# Patient Record
Sex: Male | Born: 1945 | Race: White | Hispanic: No | Marital: Married | Smoking: Former smoker
Health system: Southern US, Community
[De-identification: ages and names within clinical notes are randomized; demographics above are authoritative.]

## PROBLEM LIST (undated history)

## (undated) DIAGNOSIS — Z950 Presence of cardiac pacemaker: Secondary | ICD-10-CM

## (undated) DIAGNOSIS — F1010 Alcohol abuse, uncomplicated: Secondary | ICD-10-CM

## (undated) DIAGNOSIS — I350 Nonrheumatic aortic (valve) stenosis: Secondary | ICD-10-CM

## (undated) DIAGNOSIS — F32A Depression, unspecified: Secondary | ICD-10-CM

## (undated) DIAGNOSIS — E7800 Pure hypercholesterolemia, unspecified: Secondary | ICD-10-CM

## (undated) DIAGNOSIS — E785 Hyperlipidemia, unspecified: Secondary | ICD-10-CM

## (undated) DIAGNOSIS — I10 Essential (primary) hypertension: Secondary | ICD-10-CM

## (undated) HISTORY — PX: CARDIAC CATHETERIZATION: SHX172

## (undated) HISTORY — DX: Nonrheumatic aortic (valve) stenosis: I35.0

## (undated) HISTORY — DX: Presence of cardiac pacemaker: Z95.0

## (undated) HISTORY — PX: CORONARY ANGIOPLASTY: SHX604

## (undated) HISTORY — PX: INSERT / REPLACE / REMOVE PACEMAKER: SUR710

## (undated) HISTORY — PX: FOOT SURGERY: SHX648

## (undated) HISTORY — DX: Hyperlipidemia, unspecified: E78.5

## (undated) HISTORY — PX: CERVICAL FUSION: SHX112

---

## 1988-01-21 DIAGNOSIS — R011 Cardiac murmur, unspecified: Secondary | ICD-10-CM

## 1988-01-21 HISTORY — DX: Cardiac murmur, unspecified: R01.1

## 1993-01-20 DIAGNOSIS — D649 Anemia, unspecified: Secondary | ICD-10-CM

## 1993-01-20 HISTORY — DX: Anemia, unspecified: D64.9

## 1998-01-20 DIAGNOSIS — G4730 Sleep apnea, unspecified: Secondary | ICD-10-CM

## 1998-01-20 HISTORY — DX: Sleep apnea, unspecified: G47.30

## 2014-08-28 DIAGNOSIS — Z950 Presence of cardiac pacemaker: Secondary | ICD-10-CM

## 2014-08-28 HISTORY — DX: Presence of cardiac pacemaker: Z95.0

## 2016-01-21 DIAGNOSIS — I509 Heart failure, unspecified: Secondary | ICD-10-CM

## 2016-01-21 HISTORY — DX: Heart failure, unspecified: I50.9

## 2017-01-20 HISTORY — PX: FRACTURE SURGERY: SHX138

## 2020-03-26 ENCOUNTER — Emergency Department (HOSPITAL_COMMUNITY): Payer: Medicare HMO

## 2020-03-26 ENCOUNTER — Encounter (HOSPITAL_COMMUNITY): Payer: Self-pay | Admitting: Student

## 2020-03-26 ENCOUNTER — Emergency Department (HOSPITAL_COMMUNITY)
Admission: EM | Admit: 2020-03-26 | Discharge: 2020-03-27 | Disposition: A | Discharge status: Home / Self Care | Payer: Medicare HMO | Source: Home / Self Care | Attending: Emergency Medicine | Admitting: Emergency Medicine

## 2020-03-26 ENCOUNTER — Other Ambulatory Visit: Payer: Self-pay

## 2020-03-26 DIAGNOSIS — I130 Hypertensive heart and chronic kidney disease with heart failure and stage 1 through stage 4 chronic kidney disease, or unspecified chronic kidney disease: Secondary | ICD-10-CM | POA: Insufficient documentation

## 2020-03-26 DIAGNOSIS — N189 Chronic kidney disease, unspecified: Secondary | ICD-10-CM | POA: Insufficient documentation

## 2020-03-26 DIAGNOSIS — M25512 Pain in left shoulder: Secondary | ICD-10-CM | POA: Insufficient documentation

## 2020-03-26 DIAGNOSIS — I509 Heart failure, unspecified: Secondary | ICD-10-CM | POA: Insufficient documentation

## 2020-03-26 DIAGNOSIS — I25110 Atherosclerotic heart disease of native coronary artery with unstable angina pectoris: Secondary | ICD-10-CM | POA: Diagnosis not present

## 2020-03-26 DIAGNOSIS — R6884 Jaw pain: Secondary | ICD-10-CM | POA: Insufficient documentation

## 2020-03-26 DIAGNOSIS — R0789 Other chest pain: Secondary | ICD-10-CM | POA: Insufficient documentation

## 2020-03-26 DIAGNOSIS — I249 Acute ischemic heart disease, unspecified: Secondary | ICD-10-CM | POA: Diagnosis not present

## 2020-03-26 DIAGNOSIS — R079 Chest pain, unspecified: Secondary | ICD-10-CM

## 2020-03-26 DIAGNOSIS — Z950 Presence of cardiac pacemaker: Secondary | ICD-10-CM | POA: Insufficient documentation

## 2020-03-26 DIAGNOSIS — R61 Generalized hyperhidrosis: Secondary | ICD-10-CM | POA: Insufficient documentation

## 2020-03-26 DIAGNOSIS — R42 Dizziness and giddiness: Secondary | ICD-10-CM | POA: Insufficient documentation

## 2020-03-26 HISTORY — DX: Depression, unspecified: F32.A

## 2020-03-26 HISTORY — DX: Essential (primary) hypertension: I10

## 2020-03-26 HISTORY — DX: Pure hypercholesterolemia, unspecified: E78.00

## 2020-03-26 HISTORY — DX: Alcohol abuse, uncomplicated: F10.10

## 2020-03-26 LAB — CBC
HCT: 41.1 % (ref 39.0–52.0)
Hemoglobin: 13.9 g/dL (ref 13.0–17.0)
MCH: 30.8 pg (ref 26.0–34.0)
MCHC: 33.8 g/dL (ref 30.0–36.0)
MCV: 91.1 fL (ref 80.0–100.0)
Platelets: 232 10*3/uL (ref 150–400)
RBC: 4.51 MIL/uL (ref 4.22–5.81)
RDW: 13.2 % (ref 11.5–15.5)
WBC: 6.3 10*3/uL (ref 4.0–10.5)
nRBC: 0 % (ref 0.0–0.2)

## 2020-03-26 LAB — TROPONIN I (HIGH SENSITIVITY): Troponin I (High Sensitivity): 31 ng/L — ABNORMAL HIGH (ref <18)

## 2020-03-26 LAB — BASIC METABOLIC PANEL
Anion gap: 11 (ref 5–15)
BUN: 19 mg/dL (ref 8–23)
CO2: 21 mmol/L — ABNORMAL LOW (ref 22–32)
Calcium: 9.7 mg/dL (ref 8.9–10.3)
Chloride: 104 mmol/L (ref 98–111)
Creatinine, Ser: 1.08 mg/dL (ref 0.61–1.24)
GFR, Estimated: >60 mL/min (ref >60)
Glucose, Bld: 108 mg/dL — ABNORMAL HIGH (ref 70–99)
Potassium: 3.5 mmol/L (ref 3.5–5.1)
Sodium: 136 mmol/L (ref 135–145)

## 2020-03-26 MED ORDER — LIDOCAINE 5 % EX PTCH
1.0000 | MEDICATED_PATCH | CUTANEOUS | Status: DC
  Administered 2020-03-26: 1 via TRANSDERMAL
  Filled 2020-03-26: qty 1

## 2020-03-26 MED ORDER — ASPIRIN 81 MG PO CHEW
324.0000 mg | CHEWABLE_TABLET | Freq: Once | ORAL | Status: AC
  Administered 2020-03-26: 324 mg via ORAL
  Filled 2020-03-26: qty 4

## 2020-03-26 MED ORDER — ACETAMINOPHEN 500 MG PO TABS
1000.0000 mg | ORAL_TABLET | Freq: Once | ORAL | Status: AC
  Administered 2020-03-26: 1000 mg via ORAL
  Filled 2020-03-26: qty 2

## 2020-03-26 NOTE — ED Triage Notes (Signed)
Patient BIB EMS after checking into fellowship after being discharged from [facility]. Patient was in [facility] for alcohol detox and was sent to fellowship.  Patient had abnormal ekg upon check and ems was called. Patient complains of mild jaw pain and chest pain.  Reports has some diaphoresis once in the ambulance and has been dizzy today.

## 2020-03-26 NOTE — ED Provider Notes (Signed)
[facility] EMERGENCY DEPARTMENT Provider Note   CSN: [id number] Arrival date & time: 03/26/20  2155     History Chief Complaint  Patient presents with  . Abnormal ECG    Charles [name] is a 75 y.o. male with a hx of alcohol abuse, anxiety, aortic valve stensosi, CHF, CKD, hypertension, hyperlipidemia, and sick sinus syndrome with cardiac pacemaker who presents to the emergency department from [facility] for an abnormal EKG at their facility.  Patient states he was recently discharged from [facility] after admission for EtOH withdrawal- [facility]. Today upon intake they performed their protocol EKG on admission to their facility and found it to be abnormal therefore they called EMS.  Patient states that he was completely asymptomatic upon the time of the EKG.  However after EMS arrived and he was on the way here he did develop some aching left shoulder/chest/jaw discomfort with an episodes of diaphoresis & dizziness.  No alleviating or aggravating factors to his symptoms.  No change with exertion or deep breathing.  He states he has had similar pain in the past, had some during his most recent hospitalization and when he was in the hospital in December with chest pain.  He denies nausea, vomiting, diaphoresis, syncope, or acute leg pain/swelling.   Per chart review for additional history patient with recent hospital admission 12/31/2019 to [facility], had stable high-sensitivity troponins in the 50s, was seen by cardiology and cleared for discharge without ischemic work-up at that time.  HPI     Past Medical History:  Diagnosis Date  . Alcohol abuse   . Depression   . High cholesterol   . Hypertension     There are no problems to display for this patient.   Past Surgical History:  Procedure Laterality Date  . CERVICAL FUSION    . FOOT SURGERY         History reviewed. No pertinent  family history.  Social History   Tobacco Use  . Smoking status: Never Smoker  . Smokeless tobacco: Never Used  Substance Use Topics  . Alcohol use: Yes    Comment: 3/4 a 5th daily  . Drug use: Never    Home Medications Prior to Admission medications   Not on File    Allergies    Gabapentin and Tramadol  Review of Systems   Review of Systems  Constitutional: Positive for diaphoresis. Negative for chills and fever.  Respiratory: Negative for shortness of breath.   Cardiovascular: Positive for chest pain.  Gastrointestinal: Negative for abdominal pain, nausea and vomiting.  Neurological: Negative for seizures, syncope, speech difficulty, weakness and numbness.  All other systems reviewed and are negative.   Physical Exam Updated Vital Signs BP 139/77 (BP Location: Right Arm)   Pulse 86   Temp 98.5 F (36.9 C) (Oral)   Resp 16   SpO2 96%   Physical Exam Vitals and nursing note reviewed.  Constitutional:      General: He is not in acute distress.    Appearance: He is well-developed. He is not toxic-appearing.  HENT:     Head: Normocephalic and atraumatic.  Eyes:     General:        Right eye: No discharge.        Left eye: No discharge.     Conjunctiva/sclera: Conjunctivae normal.  Neck:     Comments: R proximal anterior neck w/ surgical scar present- well  healed, some scar tissue noted.  Cardiovascular:     Rate and Rhythm: Normal rate and regular rhythm.     Heart sounds: Murmur heard.      Comments: 2+ symmetric radial pulses. Pulmonary:     Effort: Pulmonary effort is normal. No respiratory distress.     Breath sounds: Normal breath sounds. No wheezing, rhonchi or rales.     Comments: Pacemaker present.  Chest:     Chest wall: Tenderness (left upper chest wall tenderness to palpation which reproduces patient's pain) present.  Abdominal:     General: There is no distension.     Palpations: Abdomen is soft.     Tenderness: There is no abdominal  tenderness. There is no guarding or rebound.  Musculoskeletal:     Cervical back: Neck supple.     Comments: No significant LE edema. No calf tenderness.   Skin:    General: Skin is warm and dry.     Findings: No rash.  Neurological:     Mental Status: He is alert.     Comments: Clear speech.   Psychiatric:        Behavior: Behavior normal.     ED Results / Procedures / Treatments   Labs (all labs ordered are listed, but only abnormal results are displayed) Labs Reviewed  BASIC METABOLIC PANEL - Abnormal; Notable for the following components:      Result Value   CO2 21 (*)    Glucose, Bld 108 (*)    All other components within normal limits  TROPONIN I (HIGH SENSITIVITY) - Abnormal; Notable for the following components:   Troponin I (High Sensitivity) 31 (*)    All other components within normal limits  TROPONIN I (HIGH SENSITIVITY) - Abnormal; Notable for the following components:   Troponin I (High Sensitivity) 34 (*)    All other components within normal limits  CBC    EKG EKG Interpretation  Date/Time:  Monday March 26 2020 21:57:11 EST Ventricular Rate:  83 PR Interval:    QRS Duration: 124 QT Interval:  423 QTC Calculation: 498 R Axis:   -2 Text Interpretation: Sinus rhythm Probable left atrial enlargement Left bundle branch block No old tracing to compare Confirmed by Pricilla Loveless [phone]) on 03/26/2020 10:02:18 PM   Radiology DG Chest Portable 1 View  Result Date: 03/26/2020 CLINICAL DATA:  Chest pain EXAM: PORTABLE CHEST 1 VIEW COMPARISON:  None. FINDINGS: Cardiac shadows within normal limits. Pacing device is noted. Postsurgical changes in the cervical spine are seen. Lungs are well aerated bilaterally. No focal infiltrate or sizable effusion is seen. IMPRESSION: No acute abnormality noted. Electronically Signed   By: Alcide Clever M.D.   On: 03/26/2020 22:39    Procedures Procedures   Medications Ordered in ED Medications  aspirin chewable tablet 324  mg (has no administration in time range)    ED Course  I have reviewed the triage vital signs and the nursing notes.  Pertinent labs & imaging results that were available during my care of the patient were reviewed by me and considered in my medical decision making (see chart for details).    MDM Rules/Calculators/A&P                          Patient presents to the ED for evaluation due to [facility]-patient asymptomatic at time of abnormal EKG, developed some mild chest/jaw/shoulder discomfort in route with EMS.  On arrival he  is nontoxic, resting comfortably, vitals without significant abnormality.  On exam he does have some reproducibility of pain with left anterior chest wall palpation.   Additional history obtained:  Additional history obtained from chart review & nursing note review.  Patient had admission to [facility] in December 2021, had troponins that were elevated but were overall fairly flat, was seen by cardiology who did not feel that an ischemic work-up was necessary at that time.  Also had a CT angio that was negative for pulmonary embolism or other acute cardiopulmonary abnormality. Echocardiogram was performed systolic function hyperdynamic with EF of 70% 5%.. He has a hx of normal coronary arteries by heart cath 2 or 3 years ago, has had multiple reassuring stress tests per patient report.   Initial EKG: Left bundle branch block noted.  Lab Tests:  I Ordered, reviewed, and interpreted labs, which included:  CBC, BMP, troponins: Fairly unremarkable, no critical anemia or significant actually derangement, troponins 3 1 then 34, no significant delta elevation.  Imaging Studies ordered:  I ordered imaging studies which included CXR, I independently reviewed, formal radiology impression shows:   No acute abnormality noted.  ED Course:  Patient did report some mild increase in his discomfort to nursing staff, Tylenol and Lidoderm patch  ordered given some reproducibility of pain with chest wall palpation. Repeat EKG without significant change.   00:30: Following above interventions patient is sleeping and resting comfortably.  Low risk Wells, feel pulmonary embolism would be less likely.  Symmetric pulses, no widened mediastinum on chest x-ray, low suspicion for dissection.  No significant abnormalities on laboratory or chest x-ray imaging, specifically no pneumothorax, pneumonia, or pulmonary edema. Heart pathway score 5, troponins without significant elevation and are improved from prior in care everywhere, no sxs until after there was concern @ facility due to EKG, I have a low suspicion for ACS. Overall reassuring ED work up. Appears appropriate [facility]. We discussed results, treatment plan, need for follow-up, and return precautions with the patient. Provided opportunity for questions, patient confirmed understanding and is in agreement with plan.   This is a shared visit with supervising physician Dr. Clayborne Dana who has independently evaluated patient & in agreement with care   Portions of this note were generated with Dragon dictation software. Dictation errors may occur despite best attempts at proofreading.  Final Clinical Impression(s) / ED Diagnoses Final diagnoses:  Chest pain, unspecified type    Rx / DC Orders ED Discharge Orders    None       Cherly Anderson, PA-C 03/27/20 0301    Mesner, Barbara Cower, MD 03/27/20 [phone]

## 2020-03-27 LAB — TROPONIN I (HIGH SENSITIVITY): Troponin I (High Sensitivity): 34 ng/L — ABNORMAL HIGH (ref <18)

## 2020-03-27 NOTE — Discharge Instructions (Signed)
You were seen in the emergency department today for chest pain. Your work-up in the emergency department has been overall reassuring. Your labs have been fairly normal and or similar to previous blood work you have had done. Your EKG showed a left bundle branch block- please discuss this with your cardiologist, your troponins were mildly elevated but improved from prior.   We would like you to follow up closely with your primary care provider and/or the cardiologist provided in your discharge instructions within 1-3 days. Return to the ER immediately should you experience any new or worsening symptoms including but not limited to return of pain, worsened pain, vomiting, shortness of breath, dizziness, lightheadedness, passing out, or any other concerns that you may have.

## 2020-03-27 NOTE — ED Notes (Signed)
Report given to [facility].  THeir therapist is on the way to pick up patient and transport him back to [facility]

## 2020-03-27 NOTE — ED Notes (Signed)
Patient sleeping at this time.

## 2020-03-28 ENCOUNTER — Inpatient Hospital Stay (HOSPITAL_COMMUNITY)
Admission: EM | Admit: 2020-03-28 | Discharge: 2020-03-31 | DRG: 287 | Disposition: A | Discharge status: Home / Self Care | Payer: Medicare HMO | Attending: Cardiovascular Disease | Admitting: Cardiovascular Disease

## 2020-03-28 ENCOUNTER — Emergency Department (HOSPITAL_COMMUNITY): Payer: Medicare HMO

## 2020-03-28 DIAGNOSIS — Z79899 Other long term (current) drug therapy: Secondary | ICD-10-CM

## 2020-03-28 DIAGNOSIS — I350 Nonrheumatic aortic (valve) stenosis: Secondary | ICD-10-CM | POA: Diagnosis present

## 2020-03-28 DIAGNOSIS — I447 Left bundle-branch block, unspecified: Secondary | ICD-10-CM | POA: Diagnosis present

## 2020-03-28 DIAGNOSIS — R079 Chest pain, unspecified: Secondary | ICD-10-CM

## 2020-03-28 DIAGNOSIS — Z955 Presence of coronary angioplasty implant and graft: Secondary | ICD-10-CM

## 2020-03-28 DIAGNOSIS — I495 Sick sinus syndrome: Secondary | ICD-10-CM | POA: Diagnosis present

## 2020-03-28 DIAGNOSIS — N182 Chronic kidney disease, stage 2 (mild): Secondary | ICD-10-CM | POA: Diagnosis present

## 2020-03-28 DIAGNOSIS — I249 Acute ischemic heart disease, unspecified: Secondary | ICD-10-CM | POA: Diagnosis present

## 2020-03-28 DIAGNOSIS — Z981 Arthrodesis status: Secondary | ICD-10-CM

## 2020-03-28 DIAGNOSIS — I25110 Atherosclerotic heart disease of native coronary artery with unstable angina pectoris: Principal | ICD-10-CM | POA: Diagnosis present

## 2020-03-28 DIAGNOSIS — I129 Hypertensive chronic kidney disease with stage 1 through stage 4 chronic kidney disease, or unspecified chronic kidney disease: Secondary | ICD-10-CM | POA: Diagnosis present

## 2020-03-28 DIAGNOSIS — E785 Hyperlipidemia, unspecified: Secondary | ICD-10-CM | POA: Diagnosis present

## 2020-03-28 DIAGNOSIS — Z20822 Contact with and (suspected) exposure to covid-19: Secondary | ICD-10-CM | POA: Diagnosis present

## 2020-03-28 DIAGNOSIS — Z950 Presence of cardiac pacemaker: Secondary | ICD-10-CM

## 2020-03-28 DIAGNOSIS — Z7982 Long term (current) use of aspirin: Secondary | ICD-10-CM

## 2020-03-28 DIAGNOSIS — Z79891 Long term (current) use of opiate analgesic: Secondary | ICD-10-CM

## 2020-03-28 DIAGNOSIS — Z885 Allergy status to narcotic agent status: Secondary | ICD-10-CM

## 2020-03-28 DIAGNOSIS — Z91013 Allergy to seafood: Secondary | ICD-10-CM

## 2020-03-28 DIAGNOSIS — Z888 Allergy status to other drugs, medicaments and biological substances status: Secondary | ICD-10-CM

## 2020-03-28 DIAGNOSIS — I421 Obstructive hypertrophic cardiomyopathy: Secondary | ICD-10-CM | POA: Diagnosis present

## 2020-03-28 DIAGNOSIS — Z9109 Other allergy status, other than to drugs and biological substances: Secondary | ICD-10-CM

## 2020-03-28 HISTORY — DX: Presence of cardiac pacemaker: Z95.0

## 2020-03-28 MED ORDER — FENTANYL CITRATE (PF) 100 MCG/2ML IJ SOLN: 50.0000 ug | Freq: Once | INTRAMUSCULAR | Status: DC

## 2020-03-28 NOTE — ED Provider Notes (Signed)
[facility] EMERGENCY DEPARTMENT Provider Note   CSN: [id number] Arrival date & time: 03/28/20  2316     History Chief Complaint  Patient presents with  . Chest Pain    Charles [name] is a 75 y.o. male with a history of alcohol abuse, anxiety, aortic valve stenosis, CHF, hypertension, hyperlipidemia, CKD, and sick sinus syndrome with cardiac pacemaker who presents [facility] the ED [facility] for evaluation of chest pain that began 1 hour PTA. Patient states pain is to the left chest, radiates to shoulder/jaw, feels like an ache, currently a 6/10 in severity without alleviating/aggravating factors. Has had some lightheadedness/diaphoresis with sxs. Received aspirin 324 mg en route by EMS, EMS relays facility gave him 1 nitroglycerin and his systolic BP went from 160 to 80. No additional NTG given. Patient denies nausea, vomiting, syncope, leg pain/swelling, or abdominal pain.   HPI     Past Medical History:  Diagnosis Date  . Alcohol abuse   . Depression   . High cholesterol   . Hypertension     There are no problems to display for this patient.   Past Surgical History:  Procedure Laterality Date  . CERVICAL FUSION    . FOOT SURGERY         No family history on file.  Social History   Tobacco Use  . Smoking status: Never Smoker  . Smokeless tobacco: Never Used  Substance Use Topics  . Alcohol use: Yes    Comment: 3/4 a 5th daily  . Drug use: Never    Home Medications Prior to Admission medications   Medication Sig Start Date End Date Taking? Authorizing Provider  amitriptyline (ELAVIL) 25 MG tablet Take 25 mg by mouth at bedtime.    [provider]  amLODipine (NORVASC) 5 MG tablet Take 5 mg by mouth daily. 01/05/20   [provider]  aspirin 81 MG EC tablet Take 81 mg by mouth daily. 12/07/19   [provider]  atorvastatin (LIPITOR) 40 MG tablet Take 40 mg by mouth daily. 09/30/19   [provider]  Calcium Ascorbate 500 MG TABS Take 500 mg by mouth in the morning and at bedtime.    [provider]  Cholecalciferol 50 MCG (2000 UT) CAPS Take 2,000 Units by mouth daily.    [provider]  DULoxetine (CYMBALTA) 60 MG capsule Take 60 mg by mouth daily. 03/26/20   [provider]  furosemide (LASIX) 20 MG tablet Take 20 mg by mouth daily. 03/07/20   [provider]  lisinopril (ZESTRIL) 40 MG tablet Take 40 mg by mouth daily. 08/30/19   [provider]  metoprolol tartrate (LOPRESSOR) 50 MG tablet Take 150 mg by mouth daily. 10/01/19   [provider]  omeprazole (PRILOSEC) 20 MG capsule Take 20 mg by mouth at bedtime. 03/07/20   [provider]  traZODone (DESYREL) 150 MG tablet Take 150 mg by mouth at bedtime. 02/10/20   [provider]    Allergies    Gabapentin and Tramadol  Review of Systems   Review of Systems  Constitutional: Positive for diaphoresis. Negative for fever.  Respiratory: Negative for shortness of breath.   Cardiovascular: Positive for chest pain.  Gastrointestinal: Negative for abdominal pain, nausea and vomiting.  Neurological: Positive for light-headedness. Negative for syncope.  All other systems reviewed and are negative.   Physical Exam Updated Vital Signs BP (!) 168/58   Pulse 60   Resp  10   SpO2 98%  Temp: 97.8 F orally  Physical Exam Vitals and nursing note reviewed.  Constitutional:      General: He is not in acute distress.    Appearance: He is well-developed. He is not toxic-appearing.  HENT:     Head: Normocephalic and atraumatic.  Eyes:     General:        Right eye: No discharge.        Left eye: No discharge.     Conjunctiva/sclera: Conjunctivae normal.  Cardiovascular:     Rate and Rhythm: Normal rate and regular rhythm.     Pulses:          Radial pulses are 2+ on the right side and 2+ on the left side.     Heart sounds: Murmur heard.     Pulmonary:     Effort: Pulmonary effort is normal. No respiratory distress.     Breath sounds: Normal breath sounds. No wheezing, rhonchi or rales.  Chest:     Chest wall: Tenderness (left anterior chest wall) present.     Comments: Left chest pacemaker present.  Abdominal:     General: There is no distension.     Palpations: Abdomen is soft.     Tenderness: There is no abdominal tenderness.  Musculoskeletal:     Cervical back: Neck supple.     Right lower leg: No tenderness. No edema.     Left lower leg: No tenderness. No edema.  Skin:    General: Skin is warm and dry.     Findings: No rash.  Neurological:     Mental Status: He is alert.     Comments: Clear speech.   Psychiatric:        Behavior: Behavior normal.     ED Results / Procedures / Treatments   Labs (all labs ordered are listed, but only abnormal results are displayed) Labs Reviewed  BASIC METABOLIC PANEL - Abnormal; Notable for the following components:      Result Value   Glucose, Bld 123 (*)    BUN 25 (*)    Creatinine, Ser 1.32 (*)    GFR, Estimated 56 (*)    All other components within normal limits  CBC - Abnormal; Notable for the following components:   RBC 4.21 (*)    All other components within normal limits  TROPONIN I (HIGH SENSITIVITY) - Abnormal; Notable for the following components:   Troponin I (High Sensitivity) 24 (*)    All other components within normal limits  TROPONIN I (HIGH SENSITIVITY) - Abnormal; Notable for the following components:   Troponin I (High Sensitivity) 24 (*)    All other components within normal limits  RESP PANEL BY RT-PCR (FLU A&B, COVID) [id number]  CBG MONITORING, ED    EKG EKG Interpretation  Date/Time:  Wednesday March 28 2020 23:20:37 EST Ventricular Rate:  67 PR Interval:    QRS Duration: 130 QT Interval:  447 QTC Calculation: 472 R Axis:   -20 Text Interpretation: Atrial-paced rhythm Left bundle branch block Confirmed by Marily Memos [phone]) on  03/28/2020 11:25:31 PM   Radiology CT Angio Chest PE W/Cm &/Or Wo Cm  Result Date: 03/29/2020 CLINICAL DATA:  Chest pain, dyspnea, pleurisy EXAM: CT ANGIOGRAPHY CHEST WITH CONTRAST TECHNIQUE: Multidetector CT imaging of the chest was performed using the standard protocol during bolus administration of intravenous contrast. Multiplanar CT image reconstructions and MIPs were obtained to evaluate the vascular anatomy. CONTRAST:  65mL OMNIPAQUE IOHEXOL 350 MG/ML  SOLN COMPARISON:  None. FINDINGS: Cardiovascular: There is adequate opacification of the a pulmonary arterial tree. There is no intraluminal filling defect identified to suggest acute pulmonary embolism. The central pulmonary arteries are of normal caliber. There is mild global cardiomegaly with moderate left ventricular hypertrophy noted. Moderate multi-vessel coronary artery calcification. Extensive calcification of the aortic valve leaflets. Mild calcification of the mitral valve annulus. No pericardial effusion. Mild atherosclerotic calcification within the thoracic aorta. The thoracic aorta is of normal caliber. Left subclavian dual lead pacemaker is seen with leads within the right atrium and right ventricle. Mediastinum/Nodes: The visualized thyroid is unremarkable. Multiple mildly enlarged right paratracheal and subcarinal lymph nodes are identified measuring up to 15 mm in short axis diameter. The esophagus is unremarkable. Lungs/Pleura: There is bibasilar atelectasis, left greater than right. The lungs are otherwise clear. No pneumothorax or pleural effusion. Central airways are widely patent. Upper Abdomen: At least mild hepatic steatosis noted. Limited images of the upper abdomen are otherwise unremarkable. Musculoskeletal: No acute bone abnormality. Osseous structures are age-appropriate. Review of the MIP images confirms the above findings. IMPRESSION: Mild global cardiomegaly with moderate left ventricular hypertrophy. Moderate multi-vessel  coronary artery calcification. Extensive degenerative calcification of the aortic valve leaflets. Echocardiography may be more helpful to assess valvular dysfunction. No pulmonary embolism. Mild hepatic steatosis. Aortic Atherosclerosis (ICD10-I70.0). Electronically Signed   By: Helyn Numbers MD   On: 03/29/2020 05:02   DG Chest Portable 1 View  Result Date: 03/28/2020 CLINICAL DATA:  Chest pain and shortness of breath. EXAM: PORTABLE CHEST 1 VIEW COMPARISON:  March 26, 2020 FINDINGS: A dual lead AICD is noted. Mild atelectasis is seen within the left lung base. This is a new finding when compared to the prior exam. There is no evidence of a pleural effusion or pneumothorax. Stable elevation of the right hemidiaphragm is seen. The heart size and mediastinal contours are within normal limits. A radiopaque fusion plate and screws are seen overlying the lower cervical spine. The visualized skeletal structures are otherwise unremarkable. IMPRESSION: Mild left basilar atelectasis. Electronically Signed   By: Aram Candela M.D.   On: 03/28/2020 23:38    Procedures Procedures   Medications Ordered in ED Medications  lidocaine (LIDODERM) 5 % 1 patch (1 patch Transdermal Patch Applied 03/29/20 0433)  sodium chloride 0.9 % bolus 500 mL (0 mLs Intravenous Stopped 03/29/20 0143)  acetaminophen (TYLENOL) tablet 650 mg (650 mg Oral Given 03/29/20 0040)  fentaNYL (SUBLIMAZE) injection 50 mcg (50 mcg Intravenous Given 03/29/20 0040)  iohexol (OMNIPAQUE) 350 MG/ML injection 75 mL (75 mLs Intravenous Contrast Given 03/29/20 0453)  fentaNYL (SUBLIMAZE) injection 50 mcg (50 mcg Intravenous Given 03/29/20 2706)    ED Course  I have reviewed the triage vital signs and the nursing notes.  Pertinent labs & imaging results that were available during my care of the patient were reviewed by me and considered in my medical decision making (see chart for details).    MDM Rules/Calculators/A&P                           Patient presents to the ED for evaluation of chest pain that began 1 hour PTA.  Nontoxic, vitals without significant abnormality.  On exam left chest wall tenderness.   Additional history obtained:  Additional history obtained from chart review & nursing note review.  Seen my myself & attending 2 days prior for similar.  Patient had admission to [facility] in  December 2021, had troponins that were elevated but were overall fairly flat, was seen by cardiology who did not feel that an ischemic work-up was necessary at that time.  Also had a CT angio that was negative for pulmonary embolism or other acute cardiopulmonary abnormality. Echocardiogram was performed systolic function hyperdynamic with EF of 70% 5%.. He has a hx of normal coronary arteries by heart cath 2 or 3 years ago, has had multiple reassuring stress tests per patient report.   Initial EKG: Left bundle branch block noted, atrial paced  Lab Tests:  I Ordered, reviewed, and interpreted labs, which included:  CBC, BMP, troponins: Fairly unremarkable, no critical anemia or significant actually derangement, troponins flat @ 24, no significant delta elevation, mild increase in creatinine- 500 cc fluid ordered.   Imaging Studies ordered:  I ordered imaging studies which included CXR & subsequent CTA of the chest, I independently reviewed, formal radiology impression shows:  CXR: Mild left basilar atelectasis. CTA: Mild global cardiomegaly with moderate left ventricular hypertrophy. Moderate multi-vessel coronary artery calcification. Extensive degenerative calcification of the aortic valve leaflets. Echocardiography may be more helpful to assess valvular dysfunction. No pulmonary embolism. Mild hepatic steatosis. Aortic Atherosclerosis  Given CT findings of moderate multi-vessel coronary artery calcification and this being patient's second ED visit this week for chest pain will discuss w/ cardiology for evaluation.  On re-assessment patient reports he had some improvement in chest pain S/p fentanyl but eventually re-occurred, had tried some non narcotic options however not much relief per patient report therefore will re-dose. Given drop in BP earlier, holding off on nitroglycerin.   06:10: CONSULT: Discussed with cardiologist Dr. Algie CofferKadakia- will evaluate patient in the ED, appreciate consultation.   Findings and plan of care discussed with supervising physician Dr. Clayborne DanaMesner who is in agreement.   Portions of this note were generated with Scientist, clinical (histocompatibility and immunogenetics)Dragon dictation software. Dictation errors may occur despite best attempts at proofreading.  Final Clinical Impression(s) / ED Diagnoses Final diagnoses:  Chest pain, unspecified type    Rx / DC Orders ED Discharge Orders    None       Cherly Andersonetrucelli, Sherell Christoffel R, PA-C 03/29/20 [id number]    Marily MemosMesner, Jason, MD 03/29/20 [phone]

## 2020-03-29 ENCOUNTER — Inpatient Hospital Stay (HOSPITAL_COMMUNITY)
Admit: 2020-03-29 | Discharge: 2020-03-29 | Disposition: A | Discharge status: Home / Self Care | Payer: Medicare HMO | Attending: Cardiovascular Disease | Admitting: Cardiovascular Disease

## 2020-03-29 ENCOUNTER — Inpatient Hospital Stay (HOSPITAL_COMMUNITY): Payer: Medicare HMO

## 2020-03-29 ENCOUNTER — Emergency Department (HOSPITAL_COMMUNITY): Payer: Medicare HMO

## 2020-03-29 ENCOUNTER — Other Ambulatory Visit: Payer: Self-pay

## 2020-03-29 ENCOUNTER — Encounter (HOSPITAL_COMMUNITY): Payer: Self-pay | Admitting: Cardiovascular Disease

## 2020-03-29 ENCOUNTER — Inpatient Hospital Stay (HOSPITAL_COMMUNITY): Payer: Medicare HMO | Admitting: Certified Registered"

## 2020-03-29 ENCOUNTER — Encounter (HOSPITAL_COMMUNITY)
Admission: EM | Disposition: A | Discharge status: Home / Self Care | Payer: Self-pay | Source: Home / Self Care | Attending: Cardiovascular Disease

## 2020-03-29 DIAGNOSIS — Z7982 Long term (current) use of aspirin: Secondary | ICD-10-CM | POA: Diagnosis not present

## 2020-03-29 DIAGNOSIS — N182 Chronic kidney disease, stage 2 (mild): Secondary | ICD-10-CM | POA: Diagnosis present

## 2020-03-29 DIAGNOSIS — I25110 Atherosclerotic heart disease of native coronary artery with unstable angina pectoris: Secondary | ICD-10-CM | POA: Diagnosis present

## 2020-03-29 DIAGNOSIS — Z888 Allergy status to other drugs, medicaments and biological substances status: Secondary | ICD-10-CM | POA: Diagnosis not present

## 2020-03-29 DIAGNOSIS — I350 Nonrheumatic aortic (valve) stenosis: Secondary | ICD-10-CM | POA: Diagnosis present

## 2020-03-29 DIAGNOSIS — I447 Left bundle-branch block, unspecified: Secondary | ICD-10-CM | POA: Diagnosis present

## 2020-03-29 DIAGNOSIS — Z950 Presence of cardiac pacemaker: Secondary | ICD-10-CM | POA: Diagnosis not present

## 2020-03-29 DIAGNOSIS — Z20822 Contact with and (suspected) exposure to covid-19: Secondary | ICD-10-CM | POA: Diagnosis present

## 2020-03-29 DIAGNOSIS — Z885 Allergy status to narcotic agent status: Secondary | ICD-10-CM | POA: Diagnosis not present

## 2020-03-29 DIAGNOSIS — I495 Sick sinus syndrome: Secondary | ICD-10-CM | POA: Diagnosis present

## 2020-03-29 DIAGNOSIS — Z91013 Allergy to seafood: Secondary | ICD-10-CM | POA: Diagnosis not present

## 2020-03-29 DIAGNOSIS — Z981 Arthrodesis status: Secondary | ICD-10-CM | POA: Diagnosis not present

## 2020-03-29 DIAGNOSIS — Z9109 Other allergy status, other than to drugs and biological substances: Secondary | ICD-10-CM | POA: Diagnosis not present

## 2020-03-29 DIAGNOSIS — Z79899 Other long term (current) drug therapy: Secondary | ICD-10-CM | POA: Diagnosis not present

## 2020-03-29 DIAGNOSIS — I249 Acute ischemic heart disease, unspecified: Secondary | ICD-10-CM | POA: Diagnosis present

## 2020-03-29 DIAGNOSIS — Z79891 Long term (current) use of opiate analgesic: Secondary | ICD-10-CM | POA: Diagnosis not present

## 2020-03-29 DIAGNOSIS — I129 Hypertensive chronic kidney disease with stage 1 through stage 4 chronic kidney disease, or unspecified chronic kidney disease: Secondary | ICD-10-CM | POA: Diagnosis present

## 2020-03-29 DIAGNOSIS — I421 Obstructive hypertrophic cardiomyopathy: Secondary | ICD-10-CM | POA: Diagnosis present

## 2020-03-29 DIAGNOSIS — Z955 Presence of coronary angioplasty implant and graft: Secondary | ICD-10-CM | POA: Diagnosis not present

## 2020-03-29 DIAGNOSIS — E785 Hyperlipidemia, unspecified: Secondary | ICD-10-CM | POA: Diagnosis present

## 2020-03-29 HISTORY — PX: TEE WITHOUT CARDIOVERSION: SHX5443

## 2020-03-29 HISTORY — PX: BUBBLE STUDY: SHX6837

## 2020-03-29 LAB — ECHOCARDIOGRAM COMPLETE
AV Mean grad: 15 mmHg
AV Peak grad: 28.5 mmHg
Ao pk vel: 2.67 m/s
Area-P 1/2: 2.8 cm2
Calc EF: 68.5 %
S' Lateral: 3.8 cm
Single Plane A2C EF: 76.2 %
Single Plane A4C EF: 65.1 %

## 2020-03-29 LAB — CBC
HCT: 39.3 % (ref 39.0–52.0)
Hemoglobin: 13 g/dL (ref 13.0–17.0)
MCH: 30.9 pg (ref 26.0–34.0)
MCHC: 33.1 g/dL (ref 30.0–36.0)
MCV: 93.3 fL (ref 80.0–100.0)
Platelets: 243 10*3/uL (ref 150–400)
RBC: 4.21 MIL/uL — ABNORMAL LOW (ref 4.22–5.81)
RDW: 13.1 % (ref 11.5–15.5)
WBC: 5.5 10*3/uL (ref 4.0–10.5)
nRBC: 0 % (ref 0.0–0.2)

## 2020-03-29 LAB — ECHO TEE
AV Mean grad: 30 mmHg
AV Peak grad: 65 mmHg
Ao pk vel: 4.03 m/s

## 2020-03-29 LAB — RESP PANEL BY RT-PCR (FLU A&B, COVID) ARPGX2
Influenza A by PCR: NEGATIVE
Influenza B by PCR: NEGATIVE
SARS Coronavirus 2 by RT PCR: NEGATIVE

## 2020-03-29 LAB — BASIC METABOLIC PANEL
Anion gap: 11 (ref 5–15)
BUN: 25 mg/dL — ABNORMAL HIGH (ref 8–23)
CO2: 22 mmol/L (ref 22–32)
Calcium: 10.1 mg/dL (ref 8.9–10.3)
Chloride: 104 mmol/L (ref 98–111)
Creatinine, Ser: 1.32 mg/dL — ABNORMAL HIGH (ref 0.61–1.24)
GFR, Estimated: 56 mL/min — ABNORMAL LOW (ref >60)
Glucose, Bld: 123 mg/dL — ABNORMAL HIGH (ref 70–99)
Potassium: 3.6 mmol/L (ref 3.5–5.1)
Sodium: 137 mmol/L (ref 135–145)

## 2020-03-29 LAB — HEPARIN LEVEL (UNFRACTIONATED): Heparin Unfractionated: 0.25 IU/mL — ABNORMAL LOW (ref 0.30–0.70)

## 2020-03-29 LAB — TROPONIN I (HIGH SENSITIVITY)
Troponin I (High Sensitivity): 24 ng/L — ABNORMAL HIGH (ref <18)
Troponin I (High Sensitivity): 24 ng/L — ABNORMAL HIGH (ref <18)

## 2020-03-29 SURGERY — ECHOCARDIOGRAM, TRANSESOPHAGEAL
Anesthesia: Monitor Anesthesia Care

## 2020-03-29 MED ORDER — FUROSEMIDE 20 MG PO TABS
20.0000 mg | ORAL_TABLET | Freq: Every day | ORAL | Status: DC
  Administered 2020-03-29 – 2020-03-31 (×2): 20 mg via ORAL
  Filled 2020-03-29 (×2): qty 1

## 2020-03-29 MED ORDER — SODIUM CHLORIDE 0.9 % IV SOLN: INTRAVENOUS | Status: DC

## 2020-03-29 MED ORDER — FENTANYL CITRATE (PF) 100 MCG/2ML IJ SOLN
50.0000 ug | Freq: Once | INTRAMUSCULAR | Status: AC
  Administered 2020-03-29: 50 ug via INTRAVENOUS
  Filled 2020-03-29: qty 2

## 2020-03-29 MED ORDER — SODIUM CHLORIDE 0.9 % IV SOLN: 250.0000 mL | INTRAVENOUS | Status: DC | PRN

## 2020-03-29 MED ORDER — PHENYLEPHRINE 40 MCG/ML (10ML) SYRINGE FOR IV PUSH (FOR BLOOD PRESSURE SUPPORT)
PREFILLED_SYRINGE | INTRAVENOUS | Status: DC | PRN
  Administered 2020-03-29: 160 ug via INTRAVENOUS
  Administered 2020-03-29: 200 ug via INTRAVENOUS

## 2020-03-29 MED ORDER — ONDANSETRON HCL 4 MG/2ML IJ SOLN: 4.0000 mg | Freq: Four times a day (QID) | INTRAMUSCULAR | Status: DC | PRN

## 2020-03-29 MED ORDER — FAMOTIDINE 20 MG PO TABS
20.0000 mg | ORAL_TABLET | Freq: Two times a day (BID) | ORAL | Status: DC
  Administered 2020-03-29 – 2020-03-31 (×3): 20 mg via ORAL
  Filled 2020-03-29 (×3): qty 1

## 2020-03-29 MED ORDER — PROPOFOL 10 MG/ML IV BOLUS
INTRAVENOUS | Status: DC | PRN
  Administered 2020-03-29 (×3): 25 mg via INTRAVENOUS

## 2020-03-29 MED ORDER — PANTOPRAZOLE SODIUM 40 MG PO TBEC
40.0000 mg | DELAYED_RELEASE_TABLET | Freq: Every day | ORAL | Status: DC
  Administered 2020-03-29 – 2020-03-31 (×3): 40 mg via ORAL
  Filled 2020-03-29 (×3): qty 1

## 2020-03-29 MED ORDER — SODIUM CHLORIDE 0.9% FLUSH
3.0000 mL | Freq: Two times a day (BID) | INTRAVENOUS | Status: DC
  Administered 2020-03-29: 3 mL via INTRAVENOUS

## 2020-03-29 MED ORDER — NITROGLYCERIN 0.4 MG SL SUBL: 0.4000 mg | SUBLINGUAL_TABLET | SUBLINGUAL | Status: DC | PRN

## 2020-03-29 MED ORDER — IOHEXOL 350 MG/ML SOLN
75.0000 mL | Freq: Once | INTRAVENOUS | Status: AC | PRN
  Administered 2020-03-29: 75 mL via INTRAVENOUS

## 2020-03-29 MED ORDER — HEPARIN BOLUS VIA INFUSION
4000.0000 [IU] | Freq: Once | INTRAVENOUS | Status: AC
  Administered 2020-03-29: 4000 [IU] via INTRAVENOUS
  Filled 2020-03-29: qty 4000

## 2020-03-29 MED ORDER — ACETAMINOPHEN 325 MG PO TABS
650.0000 mg | ORAL_TABLET | ORAL | Status: DC | PRN
  Administered 2020-03-29: 650 mg via ORAL
  Filled 2020-03-29: qty 2

## 2020-03-29 MED ORDER — PERFLUTREN LIPID MICROSPHERE
1.0000 mL | INTRAVENOUS | Status: AC | PRN
  Administered 2020-03-29: 2 mL via INTRAVENOUS
  Filled 2020-03-29: qty 10

## 2020-03-29 MED ORDER — ACETAMINOPHEN 325 MG PO TABS
650.0000 mg | ORAL_TABLET | Freq: Once | ORAL | Status: AC
  Administered 2020-03-29: 650 mg via ORAL
  Filled 2020-03-29: qty 2

## 2020-03-29 MED ORDER — HEPARIN (PORCINE) 25000 UT/250ML-% IV SOLN
1500.0000 [IU]/h | INTRAVENOUS | Status: DC
  Administered 2020-03-29: 1200 [IU]/h via INTRAVENOUS
  Administered 2020-03-29: 1400 [IU]/h via INTRAVENOUS
  Filled 2020-03-29 (×2): qty 250

## 2020-03-29 MED ORDER — LIDOCAINE 5 % EX PTCH
1.0000 | MEDICATED_PATCH | CUTANEOUS | Status: DC
  Administered 2020-03-29 – 2020-03-31 (×3): 1 via TRANSDERMAL
  Filled 2020-03-29 (×3): qty 1

## 2020-03-29 MED ORDER — SODIUM CHLORIDE 0.9% FLUSH: 3.0000 mL | INTRAVENOUS | Status: DC | PRN

## 2020-03-29 MED ORDER — AMLODIPINE BESYLATE 5 MG PO TABS
5.0000 mg | ORAL_TABLET | Freq: Every day | ORAL | Status: DC
  Administered 2020-03-29: 5 mg via ORAL
  Filled 2020-03-29: qty 1

## 2020-03-29 MED ORDER — ASPIRIN EC 81 MG PO TBEC
81.0000 mg | DELAYED_RELEASE_TABLET | Freq: Every day | ORAL | Status: DC
  Administered 2020-03-30 – 2020-03-31 (×2): 81 mg via ORAL
  Filled 2020-03-29 (×3): qty 1

## 2020-03-29 MED ORDER — ATORVASTATIN CALCIUM 40 MG PO TABS
40.0000 mg | ORAL_TABLET | Freq: Every day | ORAL | Status: DC
  Administered 2020-03-29 – 2020-03-31 (×2): 40 mg via ORAL
  Filled 2020-03-29: qty 4
  Filled 2020-03-29: qty 1

## 2020-03-29 MED ORDER — DIPHENHYDRAMINE HCL 25 MG PO CAPS
25.0000 mg | ORAL_CAPSULE | Freq: Three times a day (TID) | ORAL | Status: DC
  Filled 2020-03-29: qty 1

## 2020-03-29 MED ORDER — PROPOFOL 500 MG/50ML IV EMUL
INTRAVENOUS | Status: DC | PRN
  Administered 2020-03-29 (×2): 100 ug/kg/min via INTRAVENOUS
  Administered 2020-03-29: 150 ug/kg/min via INTRAVENOUS

## 2020-03-29 MED ORDER — LIDOCAINE 2% (20 MG/ML) 5 ML SYRINGE
INTRAMUSCULAR | Status: DC | PRN
  Administered 2020-03-29: 100 mg via INTRAVENOUS

## 2020-03-29 MED ORDER — DULOXETINE HCL 60 MG PO CPEP
60.0000 mg | ORAL_CAPSULE | Freq: Every day | ORAL | Status: DC
  Administered 2020-03-29 – 2020-03-31 (×2): 60 mg via ORAL
  Filled 2020-03-29 (×3): qty 1

## 2020-03-29 MED ORDER — PHENYLEPHRINE HCL-NACL 10-0.9 MG/250ML-% IV SOLN
INTRAVENOUS | Status: DC | PRN
  Administered 2020-03-29: 50 ug/min via INTRAVENOUS

## 2020-03-29 MED ORDER — TRAZODONE HCL 50 MG PO TABS
150.0000 mg | ORAL_TABLET | Freq: Every day | ORAL | Status: DC
  Administered 2020-03-29: 150 mg via ORAL
  Filled 2020-03-29: qty 1

## 2020-03-29 MED ORDER — SODIUM CHLORIDE 0.9 % IV BOLUS
500.0000 mL | Freq: Once | INTRAVENOUS | Status: AC
  Administered 2020-03-29: 500 mL via INTRAVENOUS

## 2020-03-29 NOTE — H&P (Signed)
Referring Physician: S. Petrucelli, PA  Charles [name] is an 75 y.o. male.                       Chief Complaint: Chest pain  HPI: 75 years old white male with h/o of alcohol abuse, anxiety, aortic valve stenosis, CHF, HTN, hyperlipidemia, CKD, Sick sinus syndrome, s/p pacemaker has recurrent chest pain radiating to left shoulder/jaw. His BP dropped from 160 to 80 mm systolic due to aortic valve stenosis. Patient denies fever, cough, nausea annd vomiting. He admits to decreasing activity over last several months. EKG: Atrial paced rhythm. CXR: mild left basilar atelectasis. CT chest showed moderate LVH and extensive calcification of aortic valve leaflets.  Past Medical History:  Diagnosis Date  . Alcohol abuse   . Depression   . High cholesterol   . Hypertension       Past Surgical History:  Procedure Laterality Date  . CERVICAL FUSION    . FOOT SURGERY      No family history on file. Social History:  reports that he has never smoked. He has never used smokeless tobacco. He reports current alcohol use. He reports that he does not use drugs.  Allergies:  Allergies  Allergen Reactions  . Shellfish-Derived Products Anaphylaxis  . Gabapentin Other (See Comments)    Hallucinations   . Tramadol Other (See Comments)    Hallucinations     (Not in a hospital admission)   Results for orders placed or performed during the hospital encounter of 03/28/20 (from the past 48 hour(s))  Basic metabolic panel     Status: Abnormal   Collection Time: 03/29/20 12:35 AM  Result Value Ref Range   Sodium 137 135 - 145 mmol/L   Potassium 3.6 3.5 - 5.1 mmol/L   Chloride 104 98 - 111 mmol/L   CO2 22 22 - 32 mmol/L   Glucose, Bld 123 (H) 70 - 99 mg/dL    Comment: Glucose reference range applies only to samples taken after fasting for at least 8 hours.   BUN 25 (H) 8 - 23 mg/dL   Creatinine, Ser 2.56 (H) 0.61 - 1.24 mg/dL   Calcium 38.9 8.9 - 37.3 mg/dL   GFR, Estimated 56 (L) >60  mL/min    Comment: (NOTE) Calculated using the CKD-EPI Creatinine Equation (2021)    Anion gap 11 5 - 15    Comment: Performed at [facility], [address] [place], [place] [address]  Troponin I (High Sensitivity)     Status: Abnormal   Collection Time: 03/29/20 12:35 AM  Result Value Ref Range   Troponin I (High Sensitivity) 24 (H) <18 ng/L    Comment: (NOTE) Elevated high sensitivity troponin I (hsTnI) values and significant  changes across serial measurements may suggest ACS but many other  chronic and acute conditions are known to elevate hsTnI results.  Refer to the "Links" section for chest pain algorithms and additional  guidance. Performed at [facility], [address], [place], [place] [address]   CBC     Status: Abnormal   Collection Time: 03/29/20 12:35 AM  Result Value Ref Range   WBC 5.5 4.0 - 10.5 K/uL   RBC 4.21 (L) 4.22 - 5.81 MIL/uL   Hemoglobin 13.0 13.0 - 17.0 g/dL   HCT 26.2 03.5 - 59.7 %   MCV 93.3 80.0 - 100.0 fL   MCH 30.9 26.0 - 34.0 pg   MCHC 33.1 30.0 -  36.0 g/dL   RDW 16.113.1 09.611.5 - 04.515.5 %   Platelets 243 150 - 400 K/uL   nRBC 0.0 0.0 - 0.2 %    Comment: Performed at [facility], [address] [place], [place] [address]  Troponin I (High Sensitivity)     Status: Abnormal   Collection Time: 03/29/20  1:26 AM  Result Value Ref Range   Troponin I (High Sensitivity) 24 (H) <18 ng/L    Comment: (NOTE) Elevated high sensitivity troponin I (hsTnI) values and significant  changes across serial measurements may suggest ACS but many other  chronic and acute conditions are known to elevate hsTnI results.  Refer to the "Links" section for chest pain algorithms and additional  guidance. Performed at [facility], [address] [place], [place] [address]   Resp Panel by RT-PCR (Flu A&B, Covid) Nasopharyngeal Swab     Status: None   Collection Time: 03/29/20  6:24 AM   Specimen: Nasopharyngeal Swab; Nasopharyngeal(NP) swabs  in vial transport medium  Result Value Ref Range   SARS Coronavirus 2 by RT PCR NEGATIVE NEGATIVE    Comment: (NOTE) SARS-CoV-2 target nucleic acids are NOT DETECTED.  The SARS-CoV-2 RNA is generally detectable in upper respiratory specimens during the acute phase of infection. The lowest concentration of SARS-CoV-2 viral copies this assay can detect is 138 copies/mL. A negative result does not preclude SARS-Cov-2 infection and should not be used as the sole basis for treatment or other patient management decisions. A negative result may occur with  improper specimen collection/handling, submission of specimen other than nasopharyngeal swab, presence of viral mutation(s) within the areas targeted by this assay, and inadequate number of viral copies(<138 copies/mL). A negative result must be combined with clinical observations, patient history, and epidemiological information. The expected result is Negative.  Fact Sheet for Patients:  BloggerCourse.comhttps://www.fda.gov/media/152166/download  Fact Sheet for Healthcare Providers:  SeriousBroker.ithttps://www.fda.gov/media/152162/download  This test is no t yet approved or cleared by the [place] FDA and  has been authorized for detection and/or diagnosis of SARS-CoV-2 by FDA under an Emergency Use Authorization (EUA). This EUA will remain  in effect (meaning this test can be used) for the duration of the COVID-19 declaration under Section 564(b)(1) of the Act, 21 U.S.C.section 360bbb-3(b)(1), unless the authorization is terminated  or revoked sooner.       Influenza A by PCR NEGATIVE NEGATIVE   Influenza B by PCR NEGATIVE NEGATIVE    Comment: (NOTE) The Xpert Xpress SARS-CoV-2/FLU/RSV plus assay is intended as an aid in the diagnosis of influenza from Nasopharyngeal swab specimens and should not be used as a sole basis for treatment. Nasal washings and aspirates are unacceptable for Xpert Xpress SARS-CoV-2/FLU/RSV testing.  Fact Sheet for  Patients: BloggerCourse.comhttps://www.fda.gov/media/152166/download  Fact Sheet for Healthcare Providers: SeriousBroker.ithttps://www.fda.gov/media/152162/download  This test is not yet approved or cleared by the [place] FDA and has been authorized for detection and/or diagnosis of SARS-CoV-2 by FDA under an Emergency Use Authorization (EUA). This EUA will remain in effect (meaning this test can be used) for the duration of the COVID-19 declaration under Section 564(b)(1) of the Act, 21 U.S.C. section 360bbb-3(b)(1), unless the authorization is terminated or revoked.  Performed at [facility], [address] [place], [place] [address]    CT Angio Chest PE W/Cm &/Or Wo Cm  Result Date: 03/29/2020 CLINICAL DATA:  Chest pain, dyspnea, pleurisy EXAM: CT ANGIOGRAPHY CHEST WITH CONTRAST TECHNIQUE: Multidetector CT imaging of the chest was performed  using the standard protocol during bolus administration of intravenous contrast. Multiplanar CT image reconstructions and MIPs were obtained to evaluate the vascular anatomy. CONTRAST:  60mL OMNIPAQUE IOHEXOL 350 MG/ML SOLN COMPARISON:  None. FINDINGS: Cardiovascular: There is adequate opacification of the a pulmonary arterial tree. There is no intraluminal filling defect identified to suggest acute pulmonary embolism. The central pulmonary arteries are of normal caliber. There is mild global cardiomegaly with moderate left ventricular hypertrophy noted. Moderate multi-vessel coronary artery calcification. Extensive calcification of the aortic valve leaflets. Mild calcification of the mitral valve annulus. No pericardial effusion. Mild atherosclerotic calcification within the thoracic aorta. The thoracic aorta is of normal caliber. Left subclavian dual lead pacemaker is seen with leads within the right atrium and right ventricle. Mediastinum/Nodes: The visualized thyroid is unremarkable. Multiple mildly enlarged right paratracheal and subcarinal lymph nodes are identified  measuring up to 15 mm in short axis diameter. The esophagus is unremarkable. Lungs/Pleura: There is bibasilar atelectasis, left greater than right. The lungs are otherwise clear. No pneumothorax or pleural effusion. Central airways are widely patent. Upper Abdomen: At least mild hepatic steatosis noted. Limited images of the upper abdomen are otherwise unremarkable. Musculoskeletal: No acute bone abnormality. Osseous structures are age-appropriate. Review of the MIP images confirms the above findings. IMPRESSION: Mild global cardiomegaly with moderate left ventricular hypertrophy. Moderate multi-vessel coronary artery calcification. Extensive degenerative calcification of the aortic valve leaflets. Echocardiography may be more helpful to assess valvular dysfunction. No pulmonary embolism. Mild hepatic steatosis. Aortic Atherosclerosis (ICD10-I70.0). Electronically Signed   By: Helyn Numbers MD   On: 03/29/2020 05:02   DG Chest Portable 1 View  Result Date: 03/28/2020 CLINICAL DATA:  Chest pain and shortness of breath. EXAM: PORTABLE CHEST 1 VIEW COMPARISON:  March 26, 2020 FINDINGS: A dual lead AICD is noted. Mild atelectasis is seen within the left lung base. This is a new finding when compared to the prior exam. There is no evidence of a pleural effusion or pneumothorax. Stable elevation of the right hemidiaphragm is seen. The heart size and mediastinal contours are within normal limits. A radiopaque fusion plate and screws are seen overlying the lower cervical spine. The visualized skeletal structures are otherwise unremarkable. IMPRESSION: Mild left basilar atelectasis. Electronically Signed   By: Aram Candela M.D.   On: 03/28/2020 23:38   ECHOCARDIOGRAM COMPLETE  Result Date: 03/29/2020    ECHOCARDIOGRAM REPORT   Patient Name:   Charles [name] Date of Exam: 03/29/2020 Medical Rec #:  [id number]            Height: Accession #:    [id number]           Weight: Date of Birth:  Jun 14, 1945             BSA: Patient Age:    74 years             BP:           177/88 mmHg Patient Gender: M                    HR:           71 bpm. Exam Location:  Inpatient Procedure: 2D Echo, Cardiac Doppler, Color Doppler and Intracardiac            Opacification Agent Indications:     Aortic stenosis I35.0  History:         Patient has no prior history of Echocardiogram examinations.  Risk Factors:Hypertension, Dyslipidemia and Non-Smoker.  Sonographer:     Renella Cunas RDCS Referring Phys:  [id number] Orpah Cobb Diagnosing Phys: Orpah Cobb MD IMPRESSIONS  1. Left ventricular ejection fraction, by estimation, is 70 to 75%. The left ventricle has hyperdynamic function. The left ventricle has no regional wall motion abnormalities. There is moderate concentric left ventricular hypertrophy. Left ventricular diastolic parameters are consistent with Grade I diastolic dysfunction (impaired relaxation).  2. Right ventricular systolic function is normal. The right ventricular size is normal.  3. Left atrial size was mildly dilated.  4. Right atrial size was mildly dilated.  5. The mitral valve is degenerative. Mild mitral valve regurgitation. Moderate mitral annular calcification.  6. The aortic valve is tricuspid. Aortic valve regurgitation is trivial. Mild to moderate aortic valve stenosis.  7. There is mild (Grade II) atheroma plaque involving the aortic root and ascending aorta.  8. The inferior vena cava is normal in size with greater than 50% respiratory variability, suggesting right atrial pressure of 3 mmHg. FINDINGS  Left Ventricle: Left ventricular ejection fraction, by estimation, is 70 to 75%. The left ventricle has hyperdynamic function. The left ventricle has no regional wall motion abnormalities. Definity contrast agent was given IV to delineate the left ventricular endocardial borders. The left ventricular internal cavity size was normal in size. There is moderate concentric left ventricular hypertrophy. Left  ventricular diastolic parameters are consistent with Grade I diastolic dysfunction (impaired relaxation). Right Ventricle: The right ventricular size is normal. No increase in right ventricular wall thickness. Right ventricular systolic function is normal. Left Atrium: Left atrial size was mildly dilated. Right Atrium: Right atrial size was mildly dilated. Pericardium: There is no evidence of pericardial effusion. Mitral Valve: The mitral valve is degenerative in appearance. There is mild thickening of the mitral valve leaflet(s). There is mild calcification of the mitral valve leaflet(s). Moderate mitral annular calcification. Mild mitral valve regurgitation. Tricuspid Valve: The tricuspid valve is normal in structure. Tricuspid valve regurgitation is mild. Aortic Valve: The aortic valve is tricuspid. Aortic valve regurgitation is trivial. Mild to moderate aortic stenosis is present. Aortic valve mean gradient measures 15.0 mmHg. Aortic valve peak gradient measures 28.5 mmHg. Pulmonic Valve: The pulmonic valve was normal in structure. Pulmonic valve regurgitation is not visualized. Aorta: The aortic root is normal in size and structure. There is mild (Grade II) atheroma plaque involving the aortic root and ascending aorta. Venous: The inferior vena cava is normal in size with greater than 50% respiratory variability, suggesting right atrial pressure of 3 mmHg. IAS/Shunts: The interatrial septum was not well visualized.  LEFT VENTRICLE PLAX 2D LVIDd:         5.20 cm      Diastology LVIDs:         3.80 cm      LV e' medial:    3.68 cm/s LV PW:         1.70 cm      LV E/e' medial:  21.3 LV IVS:        1.70 cm      LV e' lateral:   4.13 cm/s LVOT diam:     2.60 cm      LV E/e' lateral: 19.0 LVOT Area:     5.31 cm  LV Volumes (MOD) LV vol d, MOD A2C: 135.0 ml LV vol d, MOD A4C: 202.0 ml LV vol s, MOD A2C: 32.1 ml LV vol s, MOD A4C: 70.4 ml LV SV MOD A2C:  102.9 ml LV SV MOD A4C:     202.0 ml LV SV MOD BP:      112.8  ml RIGHT VENTRICLE RV S prime:     14.90 cm/s TAPSE (M-mode): 2.0 cm LEFT ATRIUM             RIGHT ATRIUM LA diam:        5.00 cm RA Area:     15.10 cm LA Vol (A2C):   78.9 ml RA Volume:   38.90 ml LA Vol (A4C):   90.0 ml LA Biplane Vol: 83.3 ml  AORTIC VALVE AV Vmax:      267.00 cm/s AV Vmean:     176.667 cm/s AV VTI:       0.594 m AV Peak Grad: 28.5 mmHg AV Mean Grad: 15.0 mmHg  AORTA Ao Root diam: 3.80 cm Ao Asc diam:  3.50 cm MITRAL VALVE MV Area (PHT): 2.80 cm    SHUNTS MV Decel Time: 271 msec    Systemic Diam: 2.60 cm MV E velocity: 78.40 cm/s MV A velocity: 98.10 cm/s MV E/A ratio:  0.80 Orpah Cobb MD Electronically signed by Orpah Cobb MD Signature Date/Time: 03/29/2020/8:47:57 AM    Final     Review Of Systems Constitutional: No fever, chills, positive chronic weight gain. Eyes: No vision change, wears glasses. No discharge or pain. Ears: No hearing loss, No tinnitus. Respiratory: No asthma, COPD, pneumonias. Positive shortness of breath. No hemoptysis. Cardiovascular: Positive chest pain, no palpitation, leg edema. Gastrointestinal: No nausea, vomiting, diarrhea, constipation. No GI bleed. No hepatitis. Genitourinary: No dysuria, hematuria, kidney stone. No incontinance. Neurological: No headache, stroke, seizures.  Psychiatry: No psych facility admission for anxiety, depression, suicide. No detox. Skin: No rash. Musculoskeletal: Positive joint pain, no fibromyalgia. No neck pain, positive back pain. Lymphadenopathy: No lymphadenopathy. Hematology: No anemia or easy bruising.   Blood pressure 134/87, pulse 73, resp. rate (!) 24, SpO2 99 %. There is no height or weight on file to calculate BMI. General appearance: alert, cooperative, appears stated age and mild respiratory distress Head: Normocephalic, atraumatic. Eyes: Blue eyes, pink conjunctiva, corneas clear.  Neck: No adenopathy, no carotid bruit, no JVD, supple, symmetrical, trachea midline and thyroid not enlarged. Resp:  Clear to auscultation bilaterally. Cardio: Regular rate and rhythm, S1, S2 normal, [name]/VI systolic musical murmur, no click, rub or gallop GI: Soft, non-tender; bowel sounds normal; no organomegaly. Extremities: Trace edema, no cyanosis or clubbing. Skin: Warm and dry.  Neurologic: Alert and oriented X 3, normal strength.  Assessment/Plan Chest pain Acute coronary syndrome Aortic valve stenosis HTN Morbid obesity HLD CKD Sic sinus syndrome S/P pacemaker placement  Plan: Echocardiogram. IV heparin. Consider cardiac cath in AM.  Time spent: Review of old records, Lab, x-rays, EKG, other cardiac tests, examination, discussion with patient/Family over 70 minutes.  Ricki Rodriguez, MD  03/29/2020, 10:37 AM

## 2020-03-29 NOTE — Anesthesia Procedure Notes (Signed)
Procedure Name: MAC Date/Time: 03/29/2020 3:00 PM Performed by: Imagene Riches, CRNA Pre-anesthesia Checklist: Patient identified, Emergency Drugs available, Suction available, Patient being monitored and Timeout performed Patient Re-evaluated:Patient Re-evaluated prior to induction Oxygen Delivery Method: Nasal cannula

## 2020-03-29 NOTE — Progress Notes (Signed)
  Echocardiogram 2D Echocardiogram has been performed.  Stark Bray Swaim 03/29/2020, 8:24 AM

## 2020-03-29 NOTE — CV Procedure (Signed)
INDICATIONS:   The patient is 75 years old male with recurrent chest pain has moderate to severe AV stenosis.  PROCEDURE:  Informed consent was discussed including risks, benefits and alternatives for the procedure.  Risks include, but are not limited to, cough, sore throat, vomiting, nausea, somnolence, esophageal and stomach trauma or perforation, bleeding, low blood pressure, aspiration, pneumonia, infection, trauma to the teeth and death.    Patient was given sedation.  The oropharynx was anesthetized with topical lidocaine.  The transesophageal probe was inserted in the esophagus and stomach and multiple views were obtained.  Agitated saline was used after the transesophageal probe was removed from the body.  The patient was kept under observation until the patient left the procedure room.  The patient left the procedure room in stable condition.   COMPLICATIONS:  There were no immediate complications.  FINDINGS:  1. LEFT VENTRICLE: The left ventricle has severe concentric hypertrophy with hyperdynamic wall motion. LV EF 70-75 %/ Wall motion is normal.  No thrombus or masses seen in the left ventricle.  2. RIGHT VENTRICLE:  The right ventricle is normal in structure and function without any thrombus or masses.  Catheter/pacer wire seen in RV.  3. LEFT ATRIUM:  The left atrium is normal without any thrombus or masses.  4. LEFT ATRIAL APPENDAGE:  The left atrial appendage is free of any thrombus or masses.  5. RIGHT ATRIUM:  The right atrium is free of any thrombus or masses. Catheter/pacer wire seen in RA.   6. ATRIAL SEPTUM:  The atrial septum is normal without any ASD.  7. MITRAL VALVE:  The mitral valve is degenerative with moderate multi-jet regurgitation. No masses, stenosis or vegetations.  8. TRICUSPID VALVE:  The tricuspid valve is normal in structure and function with mild regurgitation, no masses, stenosis or vegetations.  9. AORTIC VALVE:  The aortic valve is calcific, thick  and with limited mobility. AV mean gradient around 40's and calculated AV area 0.55 and by planimetry 0.77 cm2. It had mild regurgitation. No masses or vegetations.  10. PULMONIC VALVE:  The pulmonic valve is normal in structure and function without regurgitation, masses, stenosis or vegetations.  11. AORTIC ARCH, ASCENDING AND DESCENDING AORTA:  The aorta had mild atherosclerosis in the ascending or descending aorta.  The aortic arch was normal.  12.  Superior Vena Cava : No thrombus. Positive catheter.  13.  Pulmonary Veins: Visible.  14.  Pulmonary artery: visible and normal.   IMPRESSION:   1. Severe LVH with obstruction 2. Moderate to severe AV stenosis. 2. Moderate MR. 4. Mild aortic insufficiency 5. Pacer wire in RA and RV.  RECOMMENDATIONS:    Cardiac cath tomorrow. Refer to CVTS.Marland Kitchen

## 2020-03-29 NOTE — ED Notes (Signed)
[phone] [facility] was calling wanting a update on how pt was doing, nurse said you could call and give update to the charge nurse

## 2020-03-29 NOTE — Progress Notes (Signed)
ANTICOAGULATION CONSULT NOTE  Pharmacy Consult for Heparin Indication: chest pain/ACS  Allergies  Allergen Reactions  . Shellfish-Derived Products Anaphylaxis  . Gabapentin Other (See Comments)    Hallucinations   . Tramadol Other (See Comments)    Hallucinations     Patient Measurements:   Heparin Dosing Weight: 100 kg  Vital Signs: Temp: 97.6 F (36.4 C) (03/10 1613) Temp Source: Axillary (03/10 1613) BP: 166/77 (03/10 1613) Pulse Rate: 74 (03/10 1613)  Labs: Recent Labs    03/26/20 2223 03/27/20 0023 03/29/20 0035 03/29/20 0126 03/29/20 1630  HGB 13.9  --  13.0  --   --   HCT 41.1  --  39.3  --   --   PLT 232  --  243  --   --   HEPARINUNFRC  --   --   --   --  0.25*  CREATININE 1.08  --  1.32*  --   --   TROPONINIHS 31* 34* 24* 24*  --     CrCl cannot be calculated (Unknown ideal weight.).  Assessment: Pt is a 74 YOM presenting w/ radiating chest pain. CT ruled out PE and showed moderate multi-vessel coronary artery calcification, EKG revealed left bundle branch blockage.  Pt is undergoing cardiology workup for ACS and pharmacy has been consulted to dose IV heparin.   Patient is s/p TEE with plan for CVTS evaluation.  Heparin level is slightly sub-therapeutic at 0.25 units/mL.  Heparin was not turned off during TEE per MD; no bleeding per RN.  Goal of Therapy:  Heparin level 0.3-0.7 units/ml Monitor platelets by anticoagulation protocol: Yes   Plan:  Increase heparin gtt to 1400 units/hr Check 8 hr heparin level  Chardonay Scritchfield D. Laney Potash, PharmD, BCPS, BCCCP 03/29/2020, 5:35 PM

## 2020-03-29 NOTE — ED Notes (Signed)
Verbal report given to RN room 39.

## 2020-03-29 NOTE — Anesthesia Preprocedure Evaluation (Signed)
Anesthesia Evaluation  Patient identified by MRN, date of birth, ID band Patient awake    Reviewed: Allergy & Precautions, NPO status , Patient's Chart, lab work & pertinent test results  Airway Mallampati: II  TM Distance: >3 FB Neck ROM: Full    Dental no notable dental hx.    Pulmonary neg pulmonary ROS,    Pulmonary exam normal breath sounds clear to auscultation       Cardiovascular hypertension, negative cardio ROS Normal cardiovascular exam Rhythm:Regular Rate:Normal     Neuro/Psych PSYCHIATRIC DISORDERS Depression negative neurological ROS     GI/Hepatic negative GI ROS, (+)     substance abuse  alcohol use,   Endo/Other  negative endocrine ROS  Renal/GU negative Renal ROS  negative genitourinary   Musculoskeletal negative musculoskeletal ROS (+)   Abdominal   Peds negative pediatric ROS (+)  Hematology negative hematology ROS (+)   Anesthesia Other Findings   Reproductive/Obstetrics negative OB ROS                             Anesthesia Physical Anesthesia Plan  ASA: III  Anesthesia Plan: MAC   Post-op Pain Management:    Induction: Intravenous  PONV Risk Score and Plan: 1 and Propofol infusion, TIVA and Treatment may vary due to age or medical condition  Airway Management Planned: Simple Face Mask, Natural Airway and Nasal Cannula  Additional Equipment:   Intra-op Plan:   Post-operative Plan:   Informed Consent: I have reviewed the patients History and Physical, chart, labs and discussed the procedure including the risks, benefits and alternatives for the proposed anesthesia with the patient or authorized representative who has indicated his/her understanding and acceptance.     Dental advisory given  Plan Discussed with: CRNA, Anesthesiologist and Surgeon  Anesthesia Plan Comments:         Anesthesia Quick Evaluation

## 2020-03-29 NOTE — Progress Notes (Signed)
  Echocardiogram Echocardiogram Transesophageal has been performed.  Charles [name] 03/29/2020, 3:19 PM

## 2020-03-29 NOTE — Progress Notes (Signed)
Marylu Lund from [facility] called to get an update on patient.  Authorization for release of confidential information form was located in the patient's charge.  RN advised that patient was scheduled for a heart cath tomorrow.

## 2020-03-29 NOTE — Anesthesia Postprocedure Evaluation (Signed)
Anesthesia Post Note  Patient: Charles [name]  Procedure(s) Performed: TRANSESOPHAGEAL ECHOCARDIOGRAM (TEE) (N/A ) BUBBLE STUDY     Patient location during evaluation: PACU Anesthesia Type: MAC Level of consciousness: awake and alert Pain management: pain level controlled Vital Signs Assessment: post-procedure vital signs reviewed and stable Respiratory status: spontaneous breathing, nonlabored ventilation and respiratory function stable Cardiovascular status: stable and blood pressure returned to baseline Postop Assessment: no apparent nausea or vomiting Anesthetic complications: no   No complications documented.  Last Vitals:  Vitals:   03/29/20 1515 03/29/20 1525  BP: 131/71 (!) 149/49  Pulse: 71 69  Resp: 11 12  Temp:    SpO2: 93% 97%    Last Pain:  Vitals:   03/29/20 1525  TempSrc:   PainSc: 0-No pain                 Merlinda Frederick

## 2020-03-29 NOTE — Transfer of Care (Signed)
Immediate Anesthesia Transfer of Care Note  Patient: Charles [name]  Procedure(s) Performed: TRANSESOPHAGEAL ECHOCARDIOGRAM (TEE) (N/A ) BUBBLE STUDY  Patient Location: Endoscopy Unit  Anesthesia Type:MAC  Level of Consciousness: drowsy  Airway & Oxygen Therapy: Patient Spontanous Breathing and Patient connected to nasal cannula oxygen  Post-op Assessment: Report given to RN and Post -op Vital signs reviewed and stable  Post vital signs: Reviewed and stable  Last Vitals:  Vitals Value Taken Time  BP 100/46 03/29/20 1503  Temp    Pulse 67 03/29/20 1504  Resp 33 03/29/20 1504  SpO2 97 % 03/29/20 1504  Vitals shown include unvalidated device data.  Last Pain:  Vitals:   03/29/20 1348  TempSrc: Tympanic  PainSc: 6       Patients Stated Pain Goal: 3 (31/54/00 8676)  Complications: No complications documented.

## 2020-03-29 NOTE — ED Notes (Signed)
Pt transported to TEE.

## 2020-03-29 NOTE — Progress Notes (Signed)
ANTICOAGULATION CONSULT NOTE - Initial Consult  Pharmacy Consult for Heparin Indication: chest pain/ACS  Allergies  Allergen Reactions   Shellfish-Derived Products Anaphylaxis   Gabapentin Other (See Comments)    Hallucinations    Tramadol Other (See Comments)    Hallucinations     Patient Measurements:   Heparin Dosing Weight: 100 kg  Vital Signs: Temp Source: Oral (03/09 2332) BP: 168/58 (03/10 0600) Pulse Rate: 60 (03/10 0600)  Labs: Recent Labs    03/26/20 2223 03/27/20 0023 03/29/20 0035 03/29/20 0126  HGB 13.9  --  13.0  --   HCT 41.1  --  39.3  --   PLT 232  --  243  --   CREATININE 1.08  --  1.32*  --   TROPONINIHS 31* 34* 24* 24*    CrCl cannot be calculated (Unknown ideal weight.).   Medical History: Past Medical History:  Diagnosis Date   Alcohol abuse    Depression    High cholesterol    Hypertension     Assessment: Pt is a 54 YOM presenting w/ radiating chest pain. CT ruled out PE and showed moderate multi-vessel coronary artery calcification, EKG revealed left bundle branch blockage.  Pt is undergoing cardiology workup for ACS and pharmacy has been consulted to initiate heparin. Baseline CBC is WNL, Scr increased from visit earlier this week (1.08 >> 1.32).  Goal of Therapy:  Heparin level 0.3-0.7 units/ml Monitor platelets by anticoagulation protocol: Yes   Plan:  Give 4000 units bolus x 1 Start heparin infusion at 1200 units/hr Check heparin level in 8 hours. Daily CBC and heparin level while on heparin. Continue to monitor H&H and platelets  Levada Schilling PharmD Candidate 2022 03/29/2020 7:35 AM

## 2020-03-29 NOTE — ED Notes (Signed)
Patient transported to CT via stretcher in stable condition 

## 2020-03-29 NOTE — ED Triage Notes (Signed)
[facility]. He began experiencing aching CP that radiated to the left arm. EMS gave 324 ASA and started a 20G in L. AC.   Facility gave nitro on scene and his SBPdropped from 160-80 lying flat so EMS did not adminster any nitro. They reported HR in 70's SBP 116 and 94% oxygen sat on 2L.

## 2020-03-29 NOTE — ED Notes (Signed)
Pt states chest pain "has returned to what it was", MD aware. IV being placed for CT.

## 2020-03-30 ENCOUNTER — Encounter (HOSPITAL_COMMUNITY): Payer: Self-pay | Admitting: Cardiovascular Disease

## 2020-03-30 ENCOUNTER — Encounter (HOSPITAL_COMMUNITY)
Admission: EM | Disposition: A | Discharge status: Home / Self Care | Payer: Self-pay | Source: Home / Self Care | Attending: Cardiovascular Disease

## 2020-03-30 HISTORY — PX: LEFT HEART CATH AND CORONARY ANGIOGRAPHY: CATH118249

## 2020-03-30 LAB — CBC
HCT: 37.3 % — ABNORMAL LOW (ref 39.0–52.0)
Hemoglobin: 12.7 g/dL — ABNORMAL LOW (ref 13.0–17.0)
MCH: 30.9 pg (ref 26.0–34.0)
MCHC: 34 g/dL (ref 30.0–36.0)
MCV: 90.8 fL (ref 80.0–100.0)
Platelets: 229 10*3/uL (ref 150–400)
RBC: 4.11 MIL/uL — ABNORMAL LOW (ref 4.22–5.81)
RDW: 13.1 % (ref 11.5–15.5)
WBC: 5 10*3/uL (ref 4.0–10.5)
nRBC: 0 % (ref 0.0–0.2)

## 2020-03-30 LAB — BASIC METABOLIC PANEL
Anion gap: 8 (ref 5–15)
BUN: 17 mg/dL (ref 8–23)
CO2: 24 mmol/L (ref 22–32)
Calcium: 9.5 mg/dL (ref 8.9–10.3)
Chloride: 106 mmol/L (ref 98–111)
Creatinine, Ser: 1.05 mg/dL (ref 0.61–1.24)
GFR, Estimated: >60 mL/min (ref >60)
Glucose, Bld: 116 mg/dL — ABNORMAL HIGH (ref 70–99)
Potassium: 3.5 mmol/L (ref 3.5–5.1)
Sodium: 138 mmol/L (ref 135–145)

## 2020-03-30 LAB — HEPARIN LEVEL (UNFRACTIONATED): Heparin Unfractionated: 0.29 IU/mL — ABNORMAL LOW (ref 0.30–0.70)

## 2020-03-30 LAB — LIPID PANEL
Cholesterol: 157 mg/dL (ref 0–200)
HDL: 40 mg/dL — ABNORMAL LOW (ref >40)
LDL Cholesterol: 94 mg/dL (ref 0–99)
Total CHOL/HDL Ratio: 3.9 RATIO
Triglycerides: 116 mg/dL (ref <150)
VLDL: 23 mg/dL (ref 0–40)

## 2020-03-30 LAB — PROTIME-INR
INR: 1.1 (ref 0.8–1.2)
Prothrombin Time: 13.3 seconds (ref 11.4–15.2)

## 2020-03-30 SURGERY — LEFT HEART CATH AND CORONARY ANGIOGRAPHY
Anesthesia: LOCAL

## 2020-03-30 MED ORDER — LIDOCAINE HCL (PF) 1 % IJ SOLN
INTRAMUSCULAR | Status: DC | PRN
  Administered 2020-03-30: 15 mL via INTRADERMAL

## 2020-03-30 MED ORDER — TRAZODONE HCL 50 MG PO TABS
50.0000 mg | ORAL_TABLET | Freq: Every evening | ORAL | Status: DC | PRN
  Administered 2020-03-30: 50 mg via ORAL
  Filled 2020-03-30: qty 1

## 2020-03-30 MED ORDER — MIDAZOLAM HCL 2 MG/2ML IJ SOLN
INTRAMUSCULAR | Status: DC | PRN
  Administered 2020-03-30 (×2): 1 mg via INTRAVENOUS

## 2020-03-30 MED ORDER — METOPROLOL TARTRATE 25 MG PO TABS
25.0000 mg | ORAL_TABLET | Freq: Two times a day (BID) | ORAL | Status: DC
  Administered 2020-03-30 – 2020-03-31 (×2): 25 mg via ORAL
  Filled 2020-03-30 (×2): qty 1

## 2020-03-30 MED ORDER — SODIUM CHLORIDE 0.9 % IV SOLN: 250.0000 mL | INTRAVENOUS | Status: DC | PRN

## 2020-03-30 MED ORDER — MIDAZOLAM HCL 2 MG/2ML IJ SOLN
INTRAMUSCULAR | Status: AC
  Filled 2020-03-30: qty 2

## 2020-03-30 MED ORDER — SODIUM CHLORIDE 0.9% FLUSH
3.0000 mL | Freq: Two times a day (BID) | INTRAVENOUS | Status: DC
  Administered 2020-03-31: 3 mL via INTRAVENOUS

## 2020-03-30 MED ORDER — SODIUM CHLORIDE 0.9% FLUSH: 3.0000 mL | INTRAVENOUS | Status: DC | PRN

## 2020-03-30 MED ORDER — LIDOCAINE HCL (PF) 1 % IJ SOLN
INTRAMUSCULAR | Status: AC
  Filled 2020-03-30: qty 30

## 2020-03-30 MED ORDER — HEPARIN (PORCINE) IN NACL 1000-0.9 UT/500ML-% IV SOLN
INTRAVENOUS | Status: AC
  Filled 2020-03-30: qty 1000

## 2020-03-30 MED ORDER — HEPARIN (PORCINE) IN NACL 1000-0.9 UT/500ML-% IV SOLN
INTRAVENOUS | Status: DC | PRN
  Administered 2020-03-30 (×2): 500 mL

## 2020-03-30 MED ORDER — SODIUM CHLORIDE 0.9 % IV SOLN: INTRAVENOUS | Status: DC

## 2020-03-30 MED ORDER — FENTANYL CITRATE (PF) 100 MCG/2ML IJ SOLN
INTRAMUSCULAR | Status: DC | PRN
  Administered 2020-03-30 (×2): 25 ug via INTRAVENOUS

## 2020-03-30 MED ORDER — FENTANYL CITRATE (PF) 100 MCG/2ML IJ SOLN
INTRAMUSCULAR | Status: AC
  Filled 2020-03-30: qty 2

## 2020-03-30 MED ORDER — ALPRAZOLAM 0.5 MG PO TABS
0.5000 mg | ORAL_TABLET | Freq: Once | ORAL | Status: AC
  Administered 2020-03-30: 0.5 mg via ORAL
  Filled 2020-03-30: qty 1

## 2020-03-30 MED ORDER — ALPRAZOLAM 0.25 MG PO TABS
0.2500 mg | ORAL_TABLET | Freq: Two times a day (BID) | ORAL | Status: DC | PRN
  Administered 2020-03-30 – 2020-03-31 (×3): 0.25 mg via ORAL
  Filled 2020-03-30 (×3): qty 1

## 2020-03-30 MED ORDER — IODIXANOL 320 MG/ML IV SOLN
INTRAVENOUS | Status: DC | PRN
  Administered 2020-03-30: 60 mL via INTRA_ARTERIAL

## 2020-03-30 SURGICAL SUPPLY — 7 items
CATH INFINITI 5 FR 3DRC (CATHETERS) ×2 IMPLANT
CATH INFINITI 5FR MULTPACK ANG (CATHETERS) ×2 IMPLANT
KIT HEART LEFT (KITS) ×2 IMPLANT
PACK CARDIAC CATHETERIZATION (CUSTOM PROCEDURE TRAY) ×2 IMPLANT
SHEATH PINNACLE 5F 10CM (SHEATH) ×2 IMPLANT
TRANSDUCER W/STOPCOCK (MISCELLANEOUS) ×2 IMPLANT
WIRE EMERALD 3MM-J .035X150CM (WIRE) ×2 IMPLANT

## 2020-03-30 NOTE — Progress Notes (Addendum)
Ref: Patient, No Pcp Per   Subjective:  Awake. Mild RCA disease on cardiac cath. Awaiting AV replacement.  Objective:  Vital Signs in the last 24 hours: Temp:  [97.5 F (36.4 C)-98.3 F (36.8 C)] 97.8 F (36.6 C) (03/11 0758) Pulse Rate:  [66-94] 88 (03/11 1300) Cardiac Rhythm: Normal sinus rhythm;Bundle branch block (03/11 0808) Resp:  [10-21] 17 (03/11 1300) BP: (95-188)/(46-82) 112/78 (03/11 1300) SpO2:  [90 %-100 %] 91 % (03/11 1300) Weight:  [99.8 kg] 99.8 kg (03/11 1040)  Physical Exam: BP Readings from Last 1 Encounters:  03/30/20 112/78     Wt Readings from Last 1 Encounters:  03/30/20 99.8 kg    Weight change:  Body mass index is 29.84 kg/m. HEENT: [place]/AT, Eyes-Brown, PERL, EOMI, Conjunctiva-Pink, Sclera-Non-icteric Neck: No JVD, No bruit, Trachea midline. Lungs:  Clear, Bilateral. Cardiac:  Regular rhythm, normal S1 and S2, no S3. II/VI systolic murmur. Abdomen:  Soft, non-tender. BS present. Extremities:  No edema present. No cyanosis. No clubbing. CNS: AxOx3, Cranial nerves grossly intact, moves all 4 extremities.  Skin: Warm and dry.   Intake/Output from previous day: 03/10 0701 - 03/11 0700 In: 862.3 [I.V.:862.3] Out: 1550 [Urine:1550]    Lab Results: BMET    Component Value Date/Time   NA 138 03/30/2020 0225   NA 137 03/29/2020 0035   NA 136 03/26/2020 2223   K 3.5 03/30/2020 0225   K 3.6 03/29/2020 0035   K 3.5 03/26/2020 2223   CL 106 03/30/2020 0225   CL 104 03/29/2020 0035   CL 104 03/26/2020 2223   CO2 24 03/30/2020 0225   CO2 22 03/29/2020 0035   CO2 21 (L) 03/26/2020 2223   GLUCOSE 116 (H) 03/30/2020 0225   GLUCOSE 123 (H) 03/29/2020 0035   GLUCOSE 108 (H) 03/26/2020 2223   BUN 17 03/30/2020 0225   BUN 25 (H) 03/29/2020 0035   BUN 19 03/26/2020 2223   CREATININE 1.05 03/30/2020 0225   CREATININE 1.32 (H) 03/29/2020 0035   CREATININE 1.08 03/26/2020 2223   CALCIUM 9.5 03/30/2020 0225   CALCIUM 10.1 03/29/2020 0035   CALCIUM  9.7 03/26/2020 2223   GFRNONAA >60 03/30/2020 0225   GFRNONAA 56 (L) 03/29/2020 0035   GFRNONAA >60 03/26/2020 2223   CBC    Component Value Date/Time   WBC 5.0 03/30/2020 0225   RBC 4.11 (L) 03/30/2020 0225   HGB 12.7 (L) 03/30/2020 0225   HCT 37.3 (L) 03/30/2020 0225   PLT 229 03/30/2020 0225   MCV 90.8 03/30/2020 0225   MCH 30.9 03/30/2020 0225   MCHC 34.0 03/30/2020 0225   RDW 13.1 03/30/2020 0225   HEPATIC Function Panel No results for input(s): PROT in the last 8760 hours.  Invalid input(s):  ALBUMIN,  AST,  ALT,  ALKPHOS,  BILIDIR,  IBILI HEMOGLOBIN A1C No components found for: HGA1C,  MPG CARDIAC ENZYMES No results found for: CKTOTAL, CKMB, CKMBINDEX, TROPONINI BNP No results for input(s): PROBNP in the last 8760 hours. TSH No results for input(s): TSH in the last 8760 hours. CHOLESTEROL Recent Labs    03/30/20 0225  CHOL 157    Scheduled Meds: . amLODipine  5 mg Oral Daily  . aspirin EC  81 mg Oral Daily  . atorvastatin  40 mg Oral Daily  . diphenhydrAMINE  25 mg Oral Q8H  . DULoxetine  60 mg Oral Daily  . famotidine  20 mg Oral BID  . furosemide  20 mg Oral Daily  . lidocaine  1  patch Transdermal Q24H  . pantoprazole  40 mg Oral Daily  . sodium chloride flush  3 mL Intravenous Q12H  . sodium chloride flush  3 mL Intravenous Q12H   Continuous Infusions: . sodium chloride    . sodium chloride     PRN Meds:.sodium chloride, acetaminophen, nitroGLYCERIN, ondansetron (ZOFRAN) IV, sodium chloride flush, traZODone  Assessment/Plan: Chest pain Severe aortic valve stenosis Mild CAD HTN HOCM Morbid obesity HLD CKD, stage 2 Sick sinus syndrome S/P pacemaker placement  Plan: AV replacement in near future. May discharge home tomorrow post evaluation by TAVR team. F/U 1 month.   LOS: 1 day   Time spent including chart review, lab review, examination, discussion with patient/Family : 30 min   Orpah Cobb  MD  03/30/2020, 1:32 PM

## 2020-03-30 NOTE — Progress Notes (Signed)
61fr sheath aspirated and removed from rfa, manual pressure applied for 20 minutes. Site level 0, no s+s of hematoma. Tegaderm dressing applied, bedrest instructions given. Bilateral dp and pt pulses easily palpable.    Bedrest begins at 12:05:00

## 2020-03-30 NOTE — Interval H&P Note (Signed)
History and Physical Interval Note:  03/30/2020 10:57 AM  Charles [name]  has presented today for surgery, with the diagnosis of chest pain.  The various methods of treatment have been discussed with the patient and family. After consideration of risks, benefits and other options for treatment, the patient has consented to  Procedure(s): LEFT HEART CATH AND CORONARY ANGIOGRAPHY (N/A) as a surgical intervention.  The patient's history has been reviewed, patient examined, no change in status, stable for surgery.  I have reviewed the patient's chart and labs.  Questions were answered to the patient's satisfaction.     Ricki Rodriguez

## 2020-03-30 NOTE — Progress Notes (Signed)
ANTICOAGULATION CONSULT NOTE  Pharmacy Consult for Heparin Indication: chest pain/ACS  Allergies  Allergen Reactions  . Shellfish-Derived Products Anaphylaxis  . Gabapentin Other (See Comments)    Hallucinations   . Tramadol Other (See Comments)    Hallucinations     Patient Measurements:   Heparin Dosing Weight: 100 kg  Vital Signs: Temp: 98.3 F (36.8 C) (03/10 2118) Temp Source: Oral (03/10 2118) BP: 119/59 (03/10 2352) Pulse Rate: 79 (03/10 2352)  Labs: Recent Labs    03/29/20 0035 03/29/20 0126 03/29/20 1630 03/30/20 0225  HGB 13.0  --   --  12.7*  HCT 39.3  --   --  37.3*  PLT 243  --   --  229  HEPARINUNFRC  --   --  0.25* 0.29*  CREATININE 1.32*  --   --  1.05  TROPONINIHS 24* 24*  --   --     CrCl cannot be calculated (Unknown ideal weight.).  Assessment: Pt is a 74 YOM presenting w/ radiating chest pain. CT ruled out PE and showed moderate multi-vessel coronary artery calcification, EKG revealed left bundle branch blockage.  Pt is undergoing cardiology workup for ACS and pharmacy has been consulted to dose IV heparin.   Patient is s/p TEE with plan for CVTS evaluation.  Heparin level is slightly sub-therapeutic at 0.25 units/mL.  Heparin was not turned off during TEE per MD; no bleeding per RN.  3/11 AM update:  Heparin level just below goal  Goal of Therapy:  Heparin level 0.3-0.7 units/ml Monitor platelets by anticoagulation protocol: Yes   Plan:  Increase heparin to 1500 units/hr Check 8 hr heparin level  Abran Duke, PharmD, BCPS Clinical Pharmacist Phone: [phone]

## 2020-03-31 LAB — CBC
HCT: 35.7 % — ABNORMAL LOW (ref 39.0–52.0)
Hemoglobin: 12.3 g/dL — ABNORMAL LOW (ref 13.0–17.0)
MCH: 31.5 pg (ref 26.0–34.0)
MCHC: 34.5 g/dL (ref 30.0–36.0)
MCV: 91.3 fL (ref 80.0–100.0)
Platelets: 224 10*3/uL (ref 150–400)
RBC: 3.91 MIL/uL — ABNORMAL LOW (ref 4.22–5.81)
RDW: 13.2 % (ref 11.5–15.5)
WBC: 4.5 10*3/uL (ref 4.0–10.5)
nRBC: 0 % (ref 0.0–0.2)

## 2020-03-31 LAB — BASIC METABOLIC PANEL
Anion gap: 10 (ref 5–15)
BUN: 11 mg/dL (ref 8–23)
CO2: 21 mmol/L — ABNORMAL LOW (ref 22–32)
Calcium: 9.5 mg/dL (ref 8.9–10.3)
Chloride: 108 mmol/L (ref 98–111)
Creatinine, Ser: 1.15 mg/dL (ref 0.61–1.24)
GFR, Estimated: >60 mL/min (ref >60)
Glucose, Bld: 114 mg/dL — ABNORMAL HIGH (ref 70–99)
Potassium: 3.3 mmol/L — ABNORMAL LOW (ref 3.5–5.1)
Sodium: 139 mmol/L (ref 135–145)

## 2020-03-31 MED ORDER — METOPROLOL TARTRATE 50 MG PO TABS: 50.0000 mg | ORAL_TABLET | Freq: Two times a day (BID) | ORAL | 3 refills | Status: DC

## 2020-03-31 MED ORDER — POTASSIUM CHLORIDE CRYS ER 20 MEQ PO TBCR
40.0000 meq | EXTENDED_RELEASE_TABLET | Freq: Once | ORAL | Status: AC
  Administered 2020-03-31: 40 meq via ORAL
  Filled 2020-03-31: qty 2

## 2020-03-31 NOTE — TOC Progression Note (Addendum)
Transition of Care [facility]) - Progression Note    Patient Details  Name: JOVANNY STEPHANIE MRN: [id number] Date of Birth: 06/03/1945  Transition of Care [facility]) CM/SW Contact  Patrice [place], [place] Phone Number: [phone] 03/31/2020, 1:28 PM  Clinical Narrative:     CSW spoke with Rashelle at [facility] and she stated that the facility would need the DC summary faxed first and reviewed by provider for patient to return.  CSW is awaiting DC summary.  Update 2:30pm  CSW faxed discharge summary to facility. RN reported that patient can return and will discharge today with son transporting   [facility] team will continue to assist with discharge planning needs.       Expected Discharge Plan and Services           Expected Discharge Date: 03/31/20                                     Social Determinants of Health (SDOH) Interventions    Readmission Risk Interventions No flowsheet data found.

## 2020-03-31 NOTE — Discharge Summary (Signed)
NAME: Charles [name], Charles [name] MEDICAL RECORD NO: [id number] ACCOUNT NO: [id number] DATE OF BIRTH: [birth year] FACILITY: [facility] LOCATION: [facility] PHYSICIAN: Eduardo Osier. Sharyn Lull, MD  Discharge Summary   ADMITTING PHYSICIAN:  Dr. Orpah Cobb.  ADMITTING DIAGNOSES:   1.  Acute coronary syndrome, rule out myocardial infarction. 2.  Aortic stenosis. 3.  Hypertension. 4.  Morbid obesity. 5.  Hyperlipidemia. 6.  Chronic kidney disease. 7.  Sick sinus syndrome, status post permanent pacemaker in the past.  DISCHARGE DIAGNOSES: 1.  Status post chest pain, myocardial infarction ruled out.  Status post left cardiac catheterization, noted to have mild nonobstructive coronary artery disease. 2.  Severe aortic symptomatic stenosis. 3.  Hypertension. 4.  Hypertrophic cardiomyopathy. 5.  Morbid obesity. 6.  Hyperlipidemia. 7.  Chronic kidney disease. 8.  Sick sinus syndrome, status post permanent pacemaker.  DISCHARGE HOME MEDICATIONS:   1.  Aspirin 81 mg 1 tablet daily. 2.  Atorvastatin 40 mg daily. 3.  Cholecalciferol 25 mcg 2 tablets daily. 4.  Cymbalta 60 mg daily. 5.  Lasix 20 mg daily. 6.  Lorazepam 0.5 mg half tablet twice daily as before. 7.  Multivitamin one capsule daily. 8.  Nitrostat sublingual p.r.n. 9.  Omeprazole 20 mg daily. 10.  Thiamine 100 mg daily. 11.  Trazodone 50 mg daily. 12.  Vitamin C 500 mg daily. 13.  Metoprolol tartrate has been changed to 50 mg twice daily.  DIET:  Low salt, low cholesterol.  ACTIVITY:  The patient has been advised to avoid any heavy exertion.  Post-cardiac catheterization instructions have been given.  Follow up with Structural Heart Team, has been scheduled on Tuesday, 04/03/2020, at 3:40 p.m. with Dr. Excell Seltzer.  Follow up with Dr. Algie Coffer in one month.  CONDITION AT DISCHARGE:  Stable.  BRIEF HISTORY AND HOSPITAL COURSE:  The patient is a 75 year old white male with history of alcohol abuse in the past, anxiety disorder, aortic valve stenosis,  history of congestive heart failure, hypertension, hyperlipidemia, chronic kidney disease,  sick sinus syndrome, status post permanent pacemaker, had recurrent chest pain radiating to left shoulder and jaw.  The patient denies any cough, fever, chills, nausea, vomiting.  He admits to decreasing activity over several months.  EKG done in the ED  showed paced rhythm.  Chest x-ray showed mild basilar atelectasis.  CT showed moderate LVH and extensive calcification of aortic valve leaflets.  PHYSICAL EXAMINATION: VITAL SIGNS:  His blood pressure was 134/87, pulse 73. HEENT:  Conjunctivae was pink.  Cornea clear. NECK:  Supple, no JVD, no bruit. LUNGS:  Clear to auscultation bilaterally. CARDIOVASCULAR:  Regular rate and rhythm.  S1, S2 was normal.  There was III/VI systolic murmur.  No click or rub. EXTREMITIES:  There was trace edema.  No cyanosis or clubbing. NEUROLOGIC:  Grossly intact.  LABORATORY DATA:  Sodium was 136, potassium 3.5, glucose was 108, BUN 19, creatinine 1.08.  Hemoglobin was 13.9, hematocrit 41.1, white count of 6.3.  High-sensitivity troponin I was minimally elevated at 31, 34 and 24, which is trending down.  His EKG  showed normal sinus rhythm with left bundle-branch block pattern.  A 2D echo showed good LV hyperdynamic systolic function with EF of 70-75 percent.  There was moderate left ventricular hypertrophy and mild grade I diastolic dysfunction.  Aortic valve  was tricuspid and showed severe aortic stenosis with mean gradient of 30 mmHg and peak gradient of 65 mmHg.  Cardiac catheterization showed mild nonobstructive CAD.  BRIEF HOSPITAL COURSE:  The patient was admitted to cardiac  telemetry unit.  MI was ruled out by serial enzymes and EKG.  The patient subsequently underwent left cardiac catheterization and TEE as above.  The patient tolerated the procedure well.  The  patient did not have any episodes of chest pain during the hospital stay.  Structural Heart Team  consultation was obtained.  The patient was seen by PA and the patient had a scheduled appointment as outpatient on 04/03/2020 for possible evaluation for  TAVR as outpatient.  The patient's right groin is stable with no evidence of hematoma or bruit.  The patient is ambulating in room and will be [facility] and will be followed up as outpatient as above.   PUS/SUR D: 03/31/2020 11:53:50 am T: 03/31/2020 1:41:00 pm  JOB: [id number]/ [id number]

## 2020-03-31 NOTE — Progress Notes (Signed)
RN spoke with Raynelle Fanning, Charity fundraiser at [facility]. Raynelle Fanning states that they have received DC summary and that they are prepared to accept patient back today. RN answered questions related to DC instructions. Raynelle Fanning states that it is OK for patient's son to transport him.

## 2020-03-31 NOTE — Discharge Summary (Signed)
Discharge summary dictated on 03/31/2020 dictation number is [id number]

## 2020-03-31 NOTE — Discharge Instructions (Signed)
Aortic Valve Stenosis  Aortic valve stenosis is a narrowing of the aortic valve in the heart. The aortic valve opens and closes to regulate blood flow between the left side of the heart (left ventricle) and the artery that leads away from the heart (aorta). When the aortic valve becomes narrow, it is difficult for the heart to pump blood out to the body, which causes the heart to work harder. The extra work can weaken the heart muscle over time. Aortic valve stenosis can range from mild to severe. If it is not treated, it can become more severe over time and lead to heart failure. What are the causes? This condition may be caused by:  Buildup of calcium around and on the aortic valve. This can occur with aging. This is the most common cause of aortic valve stenosis.  A heart problem that developed in the womb (birth defect).  Rheumatic fever.  Radiation to the chest. What increases the risk? You may be more likely to develop this condition if:  You are older than age 41.  You were born with an abnormal bicuspid valve. What are the signs or symptoms? You may not have any symptoms until your condition becomes severe. It may take 10-20 years for mild or moderate aortic valve stenosis to become severe. Symptoms may include:  Shortness of breath. This may get worse during physical activity.  Feeling unusually weak and tired (fatigue).  Extreme discomfort in the chest, neck, or arm during physical activity (angina).  A heartbeat that is irregular or faster than normal (palpitations).  Dizziness or fainting. This may happen when you get physically tired or after you take certain heart medicines, such as nitroglycerin. How is this diagnosed? This condition may be diagnosed with:  A physical exam.  Echocardiogram. This is a type of imaging test that uses sound waves (ultrasound) to make images of your heart. There are two kinds of this test that may be used. ? Transthoracic  echocardiogram (TTE). For this type, a wand-like tool (transducer) is moved over your chest to create ultrasound images that are recorded by a computer. ? Transesophageal echocardiogram (TEE). For this type, a flexible tube (probe) is inserted down the part of the body that moves food from your mouth to your stomach (esophagus). The heart and the esophagus are close to each other. Your health care provider will use the probe to take clear, detailed pictures of the heart.  Cardiac catheterization. For this procedure, a small, thin tube (catheter) is passed through a large vein in your neck, groin, or arm. The catheter is used to get information about arteries, structures, blood pressure, and oxygen levels in your heart.  Stress tests. These are tests that evaluate the blood supply to your heart and your heart's response to exercise. You may work with a health care provider who specializes in the heart (cardiologist) for diagnosis and treatment. How is this treated? Treatment depends on how severe your condition is and what your symptoms are. You will need to have your heart checked regularly to make sure that your condition is not getting worse or causing serious problems. Treatment may also include:  Surgery to replace your aortic valve. This is the most common treatment for aortic valve stenosis, and it is the only treatment to cure the condition. Several types of surgeries are available. The surgery may be done: ? Through a large incision over your heart (open-heart surgery). ? Through small incisions, using a flexible tube called a catheter (  transcatheter aortic valve replacement, TAVR).  Medicines that help to keep your heart rate regular.  Medicines that thin your blood (anticoagulants) to prevent blood clots.  Antibiotic medicines to help prevent infection. If your condition is mild, you may only need regular follow-up visits for monitoring. Follow these instructions at  home: Lifestyle  Limit alcohol intake to no more than 1 drink a day for nonpregnant women and 2 drinks a day for men. One drink equals 12 oz of beer, 5 oz of wine, or 1 oz of hard liquor.  Do not use any products that contain nicotine or tobacco, such as cigarettes and e-cigarettes. If you need help quitting, ask your health care provider.  Work with your health care provider to manage your blood pressure and cholesterol.  Maintain a healthy weight. Eating and drinking  Eat a heart-healthy diet that includes plenty of fresh fruits and vegetables, whole grains, lean protein, and low-fat or nonfat dairy.  Limit how much caffeine you drink. Caffeine can affect your heart's rate and rhythm.  Avoid foods that are: ? High in salt (sodium), saturated fat, or sugar. ? Canned or highly processed. ? Fried.  Follow instructions from your health care provider about any other eating or drinking restrictions.   Activity  Exercise regularly and return to your normal activities as told by your health care provider. Ask your health care provider what amount and type of physical activity is safe for you. ? If your aortic valve stenosis is mild, you may only need to avoid very intense physical activity, such as heavy weight lifting. ? The more severe your aortic valve stenosis is, the more activities you may need to avoid. If you are taking blood thinners:  Before you take any medicines that contain aspirin or NSAIDs, talk with your health care provider. These medicines increase your risk for dangerous bleeding.  Take your medicine exactly as told, at the same time every day.  Avoid activities that could cause injury or bruising, and follow instructions about how to prevent falls.  Wear a medical alert bracelet or carry a card that lists what medicines you take. General instructions  Take over-the-counter and prescription medicines only as told by your health care provider.  If you were  prescribed an antibiotic, take it as told by your health care provider. Do not stop taking the antibiotic even if you start to feel better.  If you are a woman and you plan to become pregnant, talk with your health care provider before you become pregnant.  Before you have any type of medical or dental procedure or surgery, tell all health care providers that you have aortic valve stenosis. This may affect the treatment that you receive.  Keep all follow-up visits as told by your health care provider. This is important. Contact a health care provider if:  You have a fever. Get help right away if:  You develop any of the following symptoms: ? Chest pain. ? Chest tightness. ? Shortness of breath. ? Trouble breathing.  You feel light-headed.  You feel like you might faint.  Your heartbeat is irregular or faster than normal. These symptoms may represent a serious problem that is an emergency. Do not wait to see if the symptoms will go away. Get medical help right away. Call your local emergency services (911 in the [place] Do not drive yourself to the hospital. Summary  Aortic valve stenosis is a narrowing of the aortic valve in the heart. The aortic valve opens  and closes to regulate blood flow between the left side of the heart (left ventricle) and the artery that leads away from the heart (aorta).  Aortic valve stenosis can range from mild to severe. If it is not treated, it can become more severe over time and lead to heart failure.  Treatment depends on how severe your condition is and what your symptoms are. You will need to have your heart checked regularly to make sure that your condition is not getting worse or causing serious problems.  Exercise regularly and return to your normal activities as told by your health care provider. Ask your health care provider what amount and type of physical activity is safe for you. This information is not intended to replace advice given to you  by your health care provider. Make sure you discuss any questions you have with your health care provider. Document Revised: 06/22/2019 Document Reviewed: 06/22/2019 Elsevier Patient Education  2021 [employer] Coronary Angiogram A coronary angiogram is an X-ray procedure that is used to examine the arteries in the heart. Contrast dye is injected through a long, thin tube (catheter) into these arteries. Then X-rays are taken to show any blockage in these arteries. You may have this procedure if you:  Are having chest pain, or other symptoms of angina, and you are at risk for heart disease.  Have an abnormal stress test or test of your heart's electrical activity (electrocardiogram, or ECG).  Have chest pain and heart failure.  Are having irregular heart rhythms. A coronary angiogram or heart catheterization can show if you have valve disease or a disease of the aorta. This procedure can also be used to check the overall function of your heart muscle. Let your health care provider know about:  Any allergies you have, including allergies to medicines or contrast dye.  All medicines you are taking, including vitamins, herbs, eye drops, creams, and over-the-counter medicines.  Any problems you or family members have had with anesthetic medicines.  Any blood disorders you have.  Any surgeries you have had.  Any history of kidney problems or kidney failure.  Any medical conditions you have.  Whether you are pregnant or may be pregnant.  Whether you are breastfeeding. What are the risks? Generally, this is a safe procedure. However, problems may occur, including:  Infection.  Allergic reaction to medicines or dyes that are used.  Bleeding from the insertion site or other places.  Damage to nearby structures, such as blood vessels, or damage to kidneys from contrast dye.  Irregular heart rhythms.  Stroke (rare).  Heart attack (rare). What happens before the  procedure? Staying hydrated Follow instructions from your health care provider about hydration, which may include:  Up to 2 hours before the procedure - you may continue to drink clear liquids, such as water, clear fruit juice, black coffee, and plain tea.   Eating and drinking restrictions Follow instructions from your health care provider about eating and drinking, which may include:  8 hours before the procedure - stop eating heavy meals or foods, such as meat, fried foods, or fatty foods.  6 hours before the procedure - stop eating light meals or foods, such as toast or cereal.  6 hours before the procedure - stop drinking milk or drinks that contain milk.  2 hours before the procedure - stop drinking clear liquids. Medicines Ask your health care provider about:  Changing or stopping your regular medicines. This is especially important if you are taking diabetes  medicines or blood thinners.  Taking medicines such as aspirin and ibuprofen. These medicines can thin your blood. Do not take these medicines unless your health care provider tells you to take them. Aspirin may be recommended before coronary angiograms even if you do not normally take it.  Taking over-the-counter medicines, vitamins, herbs, and supplements. General instructions  Do not use any products that contain nicotine or tobacco for at least 4 weeks before the procedure. These products include cigarettes, e-cigarettes, and chewing tobacco. If you need help quitting, ask your health care provider.  You may have an exam or testing.  Plan to have someone take you home from the hospital or clinic.  If you will be going home right after the procedure, plan to have someone with you for 24 hours.  Ask your health care provider: ? How your insertion site will be marked. ? What steps will be taken to help prevent infection. These may include:  Removing hair at the insertion site.  Washing skin with a germ-killing  soap.  Taking antibiotic medicine. What happens during the procedure?  You will lie on your back on an X-ray table.  An IV will be inserted into one of your veins.  Electrodes will be placed on your chest.  You will be given one or more of the following: ? A medicine to help you relax (sedative). ? A medicine to numb the catheter insertion area (local anesthetic).  You will be connected to a continuous ECG monitor.  The catheter will be inserted into an artery in one of these areas: ? Your groin area in your upper thigh. ? Your wrist. ? The fold of your arm, near your elbow.  An X-ray procedure (fluoroscopy) will be used to help guide the catheter to the opening of the blood vessel to be used.  A dye will be injected into the catheter and X-rays will be taken. The dye will help to show any narrowing or blockages in the heart arteries.  Tell your health care provider if you have chest pain or trouble breathing.  If blockages are found, another procedure may be done to open the artery.  The catheter will be removed after the fluoroscopy is complete.  A bandage (dressing) will be placed over the insertion site. Pressure will be applied to stop bleeding.  The IV will be removed. The procedure may vary among health care providers and hospitals.   What happens after the procedure?  Your blood pressure, heart rate, breathing rate, and blood oxygen level will be monitored until you leave the hospital or clinic.  You will need to lie still for a few hours, or for as long as told by your health care provider. ? If the procedure is done through the groin, you will be told not to bend or cross your legs.  The insertion site and the pulse in your foot or wrist will be checked often.  More blood tests, X-rays, and an ECG may be done.  Do not drive for 24 hours if you were given a sedative during your procedure. Summary  A coronary angiogram is an X-ray procedure that is used to  examine the arteries in the heart.  Contrast dye is injected through a long, thin tube (catheter) into each artery.  Tell your health care provider about any allergies you have, including allergies to contrast dye.  After the procedure, you will need to lie still for a few hours and drink plenty of fluids. This information  is not intended to replace advice given to you by your health care provider. Make sure you discuss any questions you have with your health care provider. Document Revised: 07/29/2018 Document Reviewed: 07/29/2018 Elsevier Patient Education  2021 [employer].

## 2020-04-01 ENCOUNTER — Encounter (HOSPITAL_COMMUNITY): Payer: Self-pay | Admitting: Cardiovascular Disease

## 2020-04-03 ENCOUNTER — Institutional Professional Consult (permissible substitution): Payer: Medicare HMO | Admitting: Cardiovascular Disease

## 2020-04-03 ENCOUNTER — Encounter: Payer: Self-pay | Admitting: Cardiovascular Disease

## 2020-04-03 ENCOUNTER — Ambulatory Visit: Payer: Medicare HMO | Admitting: Cardiovascular Disease

## 2020-04-03 ENCOUNTER — Other Ambulatory Visit: Payer: Self-pay

## 2020-04-03 VITALS — BP 132/68 | HR 89 | Ht 72.0 in | Wt 218.0 lb

## 2020-04-03 DIAGNOSIS — I350 Nonrheumatic aortic (valve) stenosis: Secondary | ICD-10-CM

## 2020-04-03 NOTE — Progress Notes (Signed)
Cardiology Office Note:    Date:  04/06/2020   ID:  Charles [name], DOB 03-21-1945, MRN [id number]  PCP:  Patient, No Pcp Per   [facility]  Cardiologist:  No primary care provider on file.  Advanced Practice Provider:  No care team member to display Electrophysiologist:  None       Referring MD: No ref. provider found   Chief Complaint  Patient presents with  . Shortness of Breath    History of Present Illness:    Charles [name] is a 75 y.o. male presents for evaluation of aortic stenosis.   The patient is here with his son today. He is currently in [place] for a 28 day alcohol rehabilitation program at [facility], and was recently hospitalized at [facility] for chest discomfort and shortness of breath, diagnosed with severe aortic stenosis. The patient lives in [place], [place], with his wife, who we have conferenced in on the telephone today. His cardiac care has been in [place], [place], and he has been followed with serial echo studies over the past several years for surveillance of aortic stenosis. 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   The patient has been very inactive over the past 3-4 years. He had a right foot injury and has had problems with his cervical spine. While at [facility], he has a fairly long walk to get to the dining room. During his walk, he has experienced both chest discomfort and shortness of breath. He has not had orthopnea, PND, or resting chest pain. On 3/9, he developed significant chest discomfort radiating to the shoulder and jaw. He was taken by EMS to the emergency room and was noted to have minimal troponin elevation in a flat trend. He was admitted by Dr Algie Coffer and underwent echo, cardiac cath, and TEE studies during his hospitalization.   Past Medical History:  Diagnosis Date  . Alcohol abuse   . Depression   . High  cholesterol   . Hypertension   . Presence of permanent cardiac pacemaker     Past Surgical History:  Procedure Laterality Date  . BUBBLE STUDY  03/29/2020   Procedure: BUBBLE STUDY;  Surgeon: Orpah Cobb, MD;  Location: [facility] ENDOSCOPY;  Service: Cardiovascular;;  . CERVICAL FUSION    . FOOT SURGERY    . INSERT / REPLACE / REMOVE PACEMAKER    . LEFT HEART CATH AND CORONARY ANGIOGRAPHY N/A 03/30/2020   Procedure: LEFT HEART CATH AND CORONARY ANGIOGRAPHY;  Surgeon: Orpah Cobb, MD;  Location: [facility] INVASIVE CV LAB;  Service: Cardiovascular;  Laterality: N/A;  . TEE WITHOUT CARDIOVERSION  03/29/2020  . TEE WITHOUT CARDIOVERSION N/A 03/29/2020   Procedure: TRANSESOPHAGEAL ECHOCARDIOGRAM (TEE);  Surgeon: Orpah Cobb, MD;  Location: [facility] ENDOSCOPY;  Service: Cardiovascular;  Laterality: N/A;    Current Medications: Current Meds  Medication Sig  . aspirin 81 MG EC tablet Take 81 mg by mouth daily.  Marland Kitchen atorvastatin (LIPITOR) 40 MG tablet Take 40 mg by mouth daily.  . Cholecalciferol 25 MCG (1000 UT) capsule Take 2,000 Units by mouth daily.  . DULoxetine HCl 60 MG CSDR Take 60 mg by mouth daily.  . furosemide (LASIX) 20 MG tablet Take 20 mg by mouth daily.  . metoprolol tartrate (LOPRESSOR) 50 MG tablet Take 1 tablet (50 mg total) by mouth 2 (two) times daily.  . Multiple Vitamins-Minerals (MULTI COMPLETE) CAPS Take 1  capsule by mouth daily.  . nitroGLYCERIN (NITROSTAT) 0.4 MG SL tablet Place 0.4 mg under the tongue every 5 (five) minutes as needed for chest pain (max 3 doses).  Marland Kitchen omeprazole (PRILOSEC) 20 MG capsule Take 20 mg by mouth at bedtime.  . thiamine 100 MG tablet Take 100 mg by mouth daily.  . traZODone (DESYREL) 50 MG tablet Take 50 mg by mouth at bedtime as needed for sleep.  . vitamin C (ASCORBIC ACID) 500 MG tablet Take 500 mg by mouth daily.  . [DISCONTINUED] furosemide (LASIX) 20 MG tablet Take 10-20 mg by mouth See admin instructions. Take 0.5 tablet (10 mg totally) by mouth daily  for 3 days; then 1 tablet (20 mg totally) by mouth daily     Allergies:   Shellfish-derived products, Gabapentin, and Tramadol   Social History   Socioeconomic History  . Marital status: Married    Spouse name: Not on file  . Number of children: Not on file  . Years of education: Not on file  . Highest education level: Not on file  Occupational History  . Not on file  Tobacco Use  . Smoking status: Former Games developer  . Smokeless tobacco: Never Used  Vaping Use  . Vaping Use: Never used  Substance and Sexual Activity  . Alcohol use: Yes    Comment: 3/4 a 5th daily  . Drug use: Never  . Sexual activity: Not on file  Other Topics Concern  . Not on file  Social History Narrative  . Not on file   Social Determinants of Health   Financial Resource Strain: Not on file  Food Insecurity: Not on file  Transportation Needs: Not on file  Physical Activity: Not on file  Stress: Not on file  Social Connections: Not on file     Family History: The patient's family history is negative for premature CAD or valvular heart disease.   ROS:   Please see the history of present illness.    All other systems reviewed and are negative.  EKGs/Labs/Other Studies Reviewed:    The following studies were reviewed today: Echo: FINDINGS  Left Ventricle: Left ventricular ejection fraction, by estimation, is 70  to 75%. The left ventricle has hyperdynamic function. The left ventricle  has no regional wall motion abnormalities. Definity contrast agent was  given IV to delineate the left  ventricular endocardial borders. The left ventricular internal cavity size  was normal in size. There is moderate concentric left ventricular  hypertrophy. Left ventricular diastolic parameters are consistent with  Grade I diastolic dysfunction (impaired  relaxation).   Right Ventricle: The right ventricular size is normal. No increase in  right ventricular wall thickness. Right ventricular systolic function  is  normal.   Left Atrium: Left atrial size was mildly dilated.   Right Atrium: Right atrial size was mildly dilated.   Pericardium: There is no evidence of pericardial effusion.   Mitral Valve: The mitral valve is degenerative in appearance. There is  mild thickening of the mitral valve leaflet(s). There is mild  calcification of the mitral valve leaflet(s). Moderate mitral annular  calcification. Mild mitral valve regurgitation.   Tricuspid Valve: The tricuspid valve is normal in structure. Tricuspid  valve regurgitation is mild.   Aortic Valve: The aortic valve is tricuspid. Aortic valve regurgitation is  trivial. Mild to moderate aortic stenosis is present. Aortic valve mean  gradient measures 15.0 mmHg. Aortic valve peak gradient measures 28.5  mmHg.   Pulmonic Valve: The pulmonic valve  was normal in structure. Pulmonic valve  regurgitation is not visualized.   Aorta: The aortic root is normal in size and structure. There is mild  (Grade II) atheroma plaque involving the aortic root and ascending aorta.   Venous: The inferior vena cava is normal in size with greater than 50%  respiratory variability, suggesting right atrial pressure of 3 mmHg.   IAS/Shunts: The interatrial septum was not well visualized.     LEFT VENTRICLE  PLAX 2D  LVIDd:     5.20 cm   Diastology  LVIDs:     3.80 cm   LV e' medial:  3.68 cm/s  LV PW:     1.70 cm   LV E/e' medial: 21.3  LV IVS:    1.70 cm   LV e' lateral:  4.13 cm/s  LVOT diam:   2.60 cm   LV E/e' lateral: 19.0  LVOT Area:   5.31 cm    LV Volumes (MOD)  LV vol d, MOD A2C: 135.0 ml  LV vol d, MOD A4C: 202.0 ml  LV vol s, MOD A2C: 32.1 ml  LV vol s, MOD A4C: 70.4 ml  LV SV MOD A2C:   102.9 ml  LV SV MOD A4C:   202.0 ml  LV SV MOD BP:   112.8 ml   RIGHT VENTRICLE  RV S prime:   14.90 cm/s  TAPSE (M-mode): 2.0 cm   LEFT ATRIUM       RIGHT ATRIUM  LA diam:    5.00 cm  RA Area:   15.10 cm  LA Vol (A2C):  78.9 ml RA Volume:  38.90 ml  LA Vol (A4C):  90.0 ml  LA Biplane Vol: 83.3 ml  AORTIC VALVE  AV Vmax:   267.00 cm/s  AV Vmean:   176.667 cm/s  AV VTI:    0.594 m  AV Peak Grad: 28.5 mmHg  AV Mean Grad: 15.0 mmHg    AORTA  Ao Root diam: 3.80 cm  Ao Asc diam: 3.50 cm   MITRAL VALVE  MV Area (PHT): 2.80 cm  SHUNTS  MV Decel Time: 271 msec  Systemic Diam: 2.60 cm  MV E velocity: 78.40 cm/s  MV A velocity: 98.10 cm/s  MV E/A ratio: 0.80   TEE: IMPRESSIONS    1. Left ventricular ejection fraction, by estimation, is 70 to 75%. The  left ventricle has hyperdynamic function. The left ventricle has no  regional wall motion abnormalities. There is severe concentric left  ventricular hypertrophy.  2. Right ventricular systolic function is normal. The right ventricular  size is normal.  3. Left atrial size was moderately dilated. No left atrial/left atrial  appendage thrombus was detected.  4. Systolic anterior leaflet motion. The mitral valve is degenerative.  Moderate mitral valve regurgitation. No evidence of mitral stenosis.  5. The aortic valve is calcified. There is severe calcifcation of the  aortic valve. There is severe thickening of the aortic valve. Aortic valve  regurgitation is mild. Severe aortic valve stenosis.  6. The inferior vena cava is normal in size with greater than 50%  respiratory variability, suggesting right atrial pressure of 3 mmHg.   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  Previously placed Mid LAD stent (unknown type) is widely patent.   AV replacement +/- HOCM treatment Surgery or TAVR.  Diagnostic Dominance: Right  Left Main  Vessel was injected. Vessel is normal in caliber. Vessel is angiographically normal. Very short vessel  Left  Anterior Descending  Vessel was injected. Vessel is moderate in size. Vessel is angiographically normal.  Previously placed Mid  LAD stent (unknown type) is widely patent. Vessel is not the culprit lesion. The lesion is type A. Previously placed stent displays no rethrombosis. Previously placed stent displays no restenosis. The stenosis was measured by a visual reading. Pressure wire/FFR was not performed on the lesion. IVUS was not performed. No optical coherence tomography (OCT) was performed.  Left Circumflex  Vessel was injected. Vessel is normal in caliber. Vessel is angiographically normal.  Right Coronary Artery  Vessel was injected. Vessel is normal in caliber. Vessel is angiographically normal.  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 Vessel is not the culprit lesion. The lesion is type A and eccentric. The lesion was not previously treated. The stenosis was measured by a visual reading. Pressure wire/FFR was not performed on the lesion. IVUS was not performed. No optical coherence tomography (OCT) was performed.   Intervention   No interventions have been documented.  Wall Motion  Resting       Severe LVH, OT obstruction and normal LV systolic function.           Coronary Diagrams   Diagnostic Dominance: Right     EKG:  EKG is not ordered today.  The ekg from 3/12 demonstrates NSR 71 bpm, LVH with QRS widening, strain pattern  Recent Labs: 03/31/2020: BUN 11; Creatinine, Ser 1.15; Hemoglobin 12.3; Platelets 224; Potassium 3.3; Sodium 139  Recent Lipid Panel    Component Value Date/Time   CHOL 157 03/30/2020 0225   TRIG 116 03/30/2020 0225   HDL 40 (L) 03/30/2020 0225   CHOLHDL 3.9 03/30/2020 0225   VLDL 23 03/30/2020 0225   LDLCALC 94 03/30/2020 0225     Risk Assessment/Calculations:       Physical Exam:    VS:  BP 132/68   Pulse 89   Ht 6' (1.829 m)   Wt 218 lb (98.9 kg)   SpO2 96%   BMI 29.57 kg/m     Wt Readings from Last 3 Encounters:  04/03/20 218 lb (98.9 kg)  03/31/20 223 lb 8.7 oz (101.4 kg)     GEN:  Well nourished, well developed in no acute distress HEENT:  Normal NECK: No JVD; BL carotid bruits LYMPHATICS: No lymphadenopathy CARDIAC: RRR, 3/6 harsh late peaking systolic murmur at the RUSB, A2 diminished RESPIRATORY:  Clear to auscultation without rales, wheezing or rhonchi  ABDOMEN: Soft, non-tender, non-distended MUSCULOSKELETAL:  No edema; No deformity  SKIN: Warm and dry NEUROLOGIC:  Alert and oriented x 3 PSYCHIATRIC:  Normal affect   STS Risk Calculator: Isolated AVR: Risk of Mortality: 1.636% Renal Failure: 3.324% Permanent Stroke: 1.069% Prolonged Ventilation: 5.170% DSW Infection: 0.099% Reoperation: 4.496% Morbidity or Mortality: 9.741% Short Length of Stay: 42.523% Long Length of Stay: 4.084%  ASSESSMENT:    1. Nonrheumatic aortic valve stenosis    PLAN:    In order of problems listed above:  1. This is a 75 year old gentleman with severe, stage D1 aortic stenosis.  He has developed New York Heart Association functional class [name] symptoms of exertional dyspnea and chest discomfort.  The patient's cardiac catheterization and echo images are reviewed.  Cardiac catheterization demonstrates patent coronary arteries with mild nonobstructive CAD.  2D echocardiogram demonstrates vigorous LV systolic function with moderate LVH.  Aortic valve is poorly visualized.  The leaflets are at least moderately calcified and restricted.  Transvalvular gradients are in the moderate range with a peak gradient  of 29 mmHg and a mean gradient of 15.  Transesophageal echo again demonstrated vigorous LV systolic function.  There is moderate mitral regurgitation present.  The aortic valve is severely restricted with moderate calcification.  The mean gradient is 30 mmHg with a peak gradient of 65 mmHg.  There is mild aortic insufficiency present.  The patient's physical exam and new onset symptoms of exertional chest discomfort and shortness of breath or consistent with severe aortic stenosis.  His noninvasive imaging studies suggest at least  moderately severe aortic stenosis.  I think it is reasonable to pursue treatment with aortic valve replacement in this patient with very typical progressive exertional symptoms. I have reviewed the natural history of aortic stenosis with the patient and their family members who are present today. We have discussed the limitations of medical therapy and the poor prognosis associated with symptomatic aortic stenosis. We have reviewed potential treatment options, including palliative medical therapy, conventional surgical aortic valve replacement, and transcatheter aortic valve replacement. We discussed treatment options in the context of the patient's specific comorbid medical conditions.  I have recommended the patient undergo a CTA study of the heart as well as the chest, abdomen, and pelvis.  This will better define anatomic suitability for TAVR.  Following his CTA studies, he will be referred for formal cardiac surgical consultation.  Considering his age, relatively poor functional capacity with longstanding sedentary lifestyle, and comorbid conditions outlined above, I think TAVR would be the preferred treatment option in this patient if CT studies show favorable anatomy.        Medication Adjustments/Labs and Tests Ordered: Current medicines are reviewed at length with the patient today.  Concerns regarding medicines are outlined above.  Orders Placed This Encounter  Procedures  . CT CORONARY MORPH W/CTA COR W/SCORE W/CA W/CM &/OR WO/CM  . CT ANGIO CHEST AORTA W/CM & OR WO/CM  . CT ANGIO ABDOMEN PELVIS  W &/OR WO CONTRAST   No orders of the defined types were placed in this encounter.   Patient Instructions  Your cardiac CT will be scheduled at [facility]: [address] [place], [place] [address] [id number]  Please follow these instructions carefully:  Hold all erectile dysfunction medications at least 3 days (72 hrs) prior to test.  On the Night Before the  Test: . Be sure to Drink plenty of water. . Do not consume any caffeinated/decaffeinated beverages or chocolate 12 hours prior to your test. . Do not take any antihistamines 12 hours prior to your test.  On the Day of the Test: . Drink plenty of water until 1 hour prior to the test. . Do not eat any food 4 hours prior to the test. . You may take your regular medications prior to the test EXCEPT: o Take 100 mg of Lopressor the morning of your test instead of 50 mg   o HOLD LASIX the morning of your test      After the Test: . Drink plenty of water. . After receiving IV contrast, you may experience a mild flushed feeling. This is normal. . On occasion, you may experience a mild rash up to 24 hours after the test. This is not dangerous. If this occurs, you can take Benadryl 25 mg and increase your fluid intake. . If you experience trouble breathing, this can be serious. If it is severe call 911 IMMEDIATELY. If it is mild, please call our office.  For non-scheduling related questions, please contact the cardiac imaging nurse  navigator should you have any questions/concerns: Rockwell AlexandriaSara Wallace, Cardiac Imaging Nurse Navigator Larey BrickMerle Prescott, Cardiac Imaging Nurse Navigator [facility] Direct Office Dial: [phone]   For scheduling needs, including cancellations and rescheduling, please call [place], [phone].     Signed, Tonny BollmanMichael Siobahn Worsley, MD  04/06/2020 6:17 AM    [facility]

## 2020-04-03 NOTE — Patient Instructions (Addendum)
Your cardiac CT will be scheduled at [facility]: [address] [place], [place] [address] [phone]  Please follow these instructions carefully:  Hold all erectile dysfunction medications at least 3 days (72 hrs) prior to test.  On the Night Before the Test: . Be sure to Drink plenty of water. . Do not consume any caffeinated/decaffeinated beverages or chocolate 12 hours prior to your test. . Do not take any antihistamines 12 hours prior to your test.  On the Day of the Test: . Drink plenty of water until 1 hour prior to the test. . Do not eat any food 4 hours prior to the test. . You may take your regular medications prior to the test EXCEPT: o Take 100 mg of Lopressor the morning of your test instead of 50 mg   o HOLD LASIX the morning of your test      After the Test: . Drink plenty of water. . After receiving IV contrast, you may experience a mild flushed feeling. This is normal. . On occasion, you may experience a mild rash up to 24 hours after the test. This is not dangerous. If this occurs, you can take Benadryl 25 mg and increase your fluid intake. . If you experience trouble breathing, this can be serious. If it is severe call 911 IMMEDIATELY. If it is mild, please call our office.  For non-scheduling related questions, please contact the cardiac imaging nurse navigator should you have any questions/concerns: Rockwell Alexandria, Cardiac Imaging Nurse Navigator Larey Brick, Cardiac Imaging Nurse Navigator [facility] Direct Office Dial: [phone]   For scheduling needs, including cancellations and rescheduling, please call [place], [phone].

## 2020-04-04 ENCOUNTER — Telehealth: Payer: Self-pay | Admitting: Cardiovascular Disease

## 2020-04-04 ENCOUNTER — Encounter: Payer: Self-pay | Admitting: Cardiovascular Disease

## 2020-04-04 NOTE — Telephone Encounter (Signed)
Gwen Her, NP at [facility] was calling in regards to the consult the patient had with Dr. Excell Seltzer yesterday.  She is wanting to know how urgent the patient's surgery is and wanted some updates regarding the plan to proceed.  If she is not available, anyone on her medical team can take the call

## 2020-04-04 NOTE — Telephone Encounter (Signed)
Spoke with Gwen Her, NP at [facility]. Reiterated to her that TAVR can wait until after he is discharged. She was worried that he would need medical services that they do not provide. Informed her he simply wanted to have his CTs completed while he is in town, but that he is also not 100% sure he will proceed. He requested they are scheduled while he is here to give him time to think about it and he will call to cancel if needed.  Scheduled the patient for TAVR CTs 3/25. Confirmed date/time with [facility]. Confirmed date/time with the patient's son, Quintyn (this was requested yesterday). Instructed him to talk with the patient and his wife and to call back next week if the patient decides he does not want to proceed. He was grateful for call and agrees with plan.

## 2020-04-10 ENCOUNTER — Other Ambulatory Visit: Payer: Self-pay

## 2020-04-10 DIAGNOSIS — I350 Nonrheumatic aortic (valve) stenosis: Secondary | ICD-10-CM

## 2020-04-13 ENCOUNTER — Other Ambulatory Visit: Payer: Self-pay

## 2020-04-13 ENCOUNTER — Ambulatory Visit (HOSPITAL_COMMUNITY)
Admission: RE | Admit: 2020-04-13 | Discharge: 2020-04-13 | Disposition: A | Discharge status: Home / Self Care | Payer: Medicare HMO | Source: Ambulatory Visit | Attending: Cardiovascular Disease | Admitting: Cardiovascular Disease

## 2020-04-13 DIAGNOSIS — I350 Nonrheumatic aortic (valve) stenosis: Secondary | ICD-10-CM

## 2020-04-13 MED ORDER — IOHEXOL 350 MG/ML SOLN
100.0000 mL | Freq: Once | INTRAVENOUS | Status: AC | PRN
  Administered 2020-04-13: 100 mL via INTRAVENOUS

## 2020-04-16 ENCOUNTER — Encounter: Payer: Self-pay | Admitting: Physician Assistant

## 2020-04-16 DIAGNOSIS — F1010 Alcohol abuse, uncomplicated: Secondary | ICD-10-CM | POA: Insufficient documentation

## 2020-04-16 DIAGNOSIS — Z950 Presence of cardiac pacemaker: Secondary | ICD-10-CM | POA: Insufficient documentation

## 2020-04-16 DIAGNOSIS — I350 Nonrheumatic aortic (valve) stenosis: Secondary | ICD-10-CM | POA: Insufficient documentation

## 2020-04-16 DIAGNOSIS — E785 Hyperlipidemia, unspecified: Secondary | ICD-10-CM | POA: Insufficient documentation

## 2020-04-16 DIAGNOSIS — I10 Essential (primary) hypertension: Secondary | ICD-10-CM | POA: Insufficient documentation

## 2020-04-17 ENCOUNTER — Encounter: Payer: Self-pay | Admitting: Surgery

## 2020-04-17 ENCOUNTER — Institutional Professional Consult (permissible substitution): Payer: Medicare HMO | Admitting: Surgery

## 2020-04-17 ENCOUNTER — Other Ambulatory Visit: Payer: Self-pay

## 2020-04-17 VITALS — BP 170/90 | HR 85 | Resp 20 | Ht 72.0 in | Wt 220.0 lb

## 2020-04-17 DIAGNOSIS — I350 Nonrheumatic aortic (valve) stenosis: Secondary | ICD-10-CM

## 2020-04-17 NOTE — Progress Notes (Signed)
Patient ID: Charles [name], male   DOB: 1945-02-14, 75 y.o.   MRN: [id number]  [facility]  MULTIDISCIPLINARY HEART VALVE CLINIC  CARDIOTHORACIC SURGERY CONSULTATION REPORT  Referring Provider is Orpah Cobb, MD Primary Cardiologist is No primary care provider on file. PCP is Patient, No Pcp Per (Inactive)  Chief Complaint  Patient presents with  . 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  He was recently hospitalized at [facility] for shortness of breath and chest discomfort with exertion and was diagnosed with severe aortic stenosis.  He lives in [place] with his wife and has been followed by cardiology in [facility].  He reports having echocardiograms over the past several years to follow his aortic stenosis.  He has not been very active over the past few years due to a right ankle injury that required multiple surgeries.  He said that he has to walk a fairly long distance to the dining room every day and has experienced substernal chest discomfort and shortness of breath with doing that.  This is relieved with rest.  He denies any orthopnea or PND.  On 03/28/2020 he developed significant chest discomfort radiating to his shoulder and jawline was taken by EMS to the emergency room where he had minimal troponin elevation and a flat trend.  He was admitted under the care of Dr. Algie Coffer and underwent work-up including echo, cardiac cath, and TEE.  2D echo showed a trileaflet aortic valve with a mean gradient of 15 mmHg and peak gradient of 28.5 mmHg.  Left ventricular ejection fraction was 70 to 75% with moderate concentric LVH.  TEE confirmed a severely calcified  and thickened aortic valve.  The mean gradient was measured at 30 mmHg with a peak gradient of 65 mmHg.  Left ventricular ejection fraction is 70 to 75% with severe concentric LVH.  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  Past Medical History:  Diagnosis Date  . Alcohol abuse   . Depression   . HLD (hyperlipidemia)   . Hypertension   . S/P placement of cardiac pacemaker   . Severe aortic stenosis     Past Surgical History:  Procedure Laterality Date  . BUBBLE STUDY  03/29/2020   Procedure: BUBBLE STUDY;  Surgeon: Orpah Cobb, MD;  Location: [facility] ENDOSCOPY;  Service: Cardiovascular;;  . CERVICAL FUSION    . FOOT SURGERY    . INSERT / REPLACE / REMOVE PACEMAKER    . LEFT HEART CATH AND CORONARY ANGIOGRAPHY N/A 03/30/2020   Procedure: LEFT HEART CATH AND CORONARY ANGIOGRAPHY;  Surgeon: Orpah Cobb, MD;  Location: [facility] INVASIVE CV LAB;  Service: Cardiovascular;  Laterality: N/A;  . TEE WITHOUT CARDIOVERSION  03/29/2020  . TEE WITHOUT CARDIOVERSION N/A 03/29/2020   Procedure: TRANSESOPHAGEAL ECHOCARDIOGRAM (TEE);  Surgeon: Orpah Cobb, MD;  Location: [facility] ENDOSCOPY;  Service: Cardiovascular;  Laterality: N/A;    History reviewed. No pertinent family history.  Social History   Socioeconomic History  . Marital status: Married    Spouse name: Not on file  . Number of children: Not on file  . Years of education: Not on file  . Highest education level: Not on file  Occupational History  .  Not on file  Tobacco Use  . Smoking status: Former Games developer  . Smokeless tobacco: Never Used  Vaping Use  . Vaping Use: Never used  Substance and Sexual Activity  . Alcohol use: Yes    Comment: 3/4 a 5th daily  . Drug use: Never  . Sexual activity: Not on file  Other Topics Concern  . Not on file  Social History Narrative  . Not on file   Social Determinants of Health   Financial Resource Strain: Not on file  Food Insecurity: Not on file  Transportation Needs:  Not on file  Physical Activity: Not on file  Stress: Not on file  Social Connections: Not on file  Intimate Partner Violence: Not on file    Current Outpatient Medications  Medication Sig Dispense Refill  . aspirin 81 MG EC tablet Take 81 mg by mouth daily.    Marland Kitchen atorvastatin (LIPITOR) 40 MG tablet Take 40 mg by mouth daily.    . Cholecalciferol 25 MCG (1000 UT) capsule Take 2,000 Units by mouth daily.    . DULoxetine HCl 60 MG CSDR Take 60 mg by mouth daily.    . furosemide (LASIX) 20 MG tablet Take 20 mg by mouth daily.    Marland Kitchen lisinopril (ZESTRIL) 20 MG tablet Take 20 mg by mouth daily.    . metoprolol tartrate (LOPRESSOR) 50 MG tablet Take 1 tablet (50 mg total) by mouth 2 (two) times daily. 60 tablet 3  . Multiple Vitamins-Minerals (MULTI COMPLETE) CAPS Take 1 capsule by mouth daily.    . nitroGLYCERIN (NITROSTAT) 0.4 MG SL tablet Place 0.4 mg under the tongue every 5 (five) minutes as needed for chest pain (max 3 doses).    Marland Kitchen omeprazole (PRILOSEC) 20 MG capsule Take 20 mg by mouth at bedtime.    . thiamine 100 MG tablet Take 100 mg by mouth daily.    . traZODone (DESYREL) 50 MG tablet Take 50 mg by mouth at bedtime as needed for sleep.    . vitamin C (ASCORBIC ACID) 500 MG tablet Take 500 mg by mouth daily.     No current facility-administered medications for this visit.    Allergies  Allergen Reactions  . Shellfish-Derived Products Anaphylaxis  . Gabapentin Other (See Comments)    Hallucinations   . Tramadol Other (See Comments)    Hallucinations       Review of Systems:   General:  normal appetite, + decreased energy, no weight gain, no weight loss, no fever  Cardiac:  + chest pain with exertion, no chest pain at rest, +SOB with mild exertion, no resting SOB, no PND, no orthopnea, no palpitations, no arrhythmia, no atrial fibrillation, no LE edema, no dizzy spells, no syncope  Respiratory:  + exertional shortness of breath, no home oxygen, no productive cough, no dry  cough, no bronchitis, no wheezing, no hemoptysis, no asthma, no pain with inspiration or cough, + sleep apnea, no CPAP at night  GI:   no difficulty swallowing, no reflux, no frequent heartburn, no hiatal hernia, no abdominal pain, no constipation, no diarrhea, no hematochezia, no hematemesis, no melena  GU:   no dysuria,  no frequency, no urinary tract infection, no hematuria, no enlarged prostate, no kidney stones, no kidney disease  Vascular:  no pain suggestive of claudication, no pain in feet, no leg cramps, no varicose veins, no DVT, no non-healing foot ulcer  Neuro:   no stroke, no TIA's, no seizures, no headaches, no temporary blindness one eye,  no slurred speech, no peripheral neuropathy, no chronic pain, no instability of gait, no memory/cognitive dysfunction  Musculoskeletal: no arthritis, no joint swelling, no myalgias, no difficulty walking, normal mobility   Skin:   no rash, no itching, no skin infections, no pressure sores or ulcerations  Psych:   no anxiety, no depression, no nervousness, no unusual recent stress  Eyes:   no blurry vision, no floaters, no recent vision changes, + wears glasses   ENT:   no hearing loss, no loose or painful teeth, no dentures, last saw dentist 04/10/20  Hematologic:  no easy bruising, no abnormal bleeding, no clotting disorder, no frequent epistaxis  Endocrine:  no diabetes, does not check CBG's at home      Physical Exam:   BP (!) 170/90   Pulse 85   Resp 20   Ht 6' (1.829 m)   Wt 99.8 kg   SpO2 95% Comment: RA  BMI 29.84 kg/m   General:             well-appearing  HEENT:  Unremarkable, NCAT, PERLA, EOMI,   Neck:   no JVD, no bruits, no adenopathy   Chest:   clear to auscultation, symmetrical breath sounds, no wheezes, no rhonchi   CV:   RRR, grade lll/VI crescendo/decrescendo murmur heard best at RSB,  no diastolic murmur  Abdomen:  soft, non-tender, no masses   Extremities:  warm, well-perfused, pulses palpable at ankle, no LE  edema  Rectal/GU  Deferred  Neuro:   Grossly non-focal and symmetrical throughout  Skin:   Clean and dry, no rashes, no breakdown   Diagnostic Tests:  Patient Name:  Charles [name] Date of Exam: 03/29/2020  Medical Rec #: [id number]      Height:  Accession #:  [id number]      Weight:  Date of Birth: 04-05-1945      BSA:  Patient Age:  74 years       BP:      177/88 mmHg  Patient Gender: M          HR:      71 bpm.  Exam Location: Inpatient   Procedure: 2D Echo, Cardiac Doppler, Color Doppler and Intracardiac       Opacification Agent   Indications:   Aortic stenosis I35.0    History:     Patient has no prior history of Echocardiogram  examinations.          Risk Factors:Hypertension, Dyslipidemia and Non-Smoker.    Sonographer:   Renella Cunas RDCS  Referring Phys: [id number] Orpah Cobb  Diagnosing Phys: Orpah Cobb MD   IMPRESSIONS    1. Left ventricular ejection fraction, by estimation, is 70 to 75%. The  left ventricle has hyperdynamic function. The left ventricle has no  regional wall motion abnormalities. There is moderate concentric left  ventricular hypertrophy. Left ventricular  diastolic parameters are consistent with Grade I diastolic dysfunction  (impaired relaxation).  2. Right ventricular systolic function is normal. The right ventricular  size is normal.  3. Left atrial size was mildly dilated.  4. Right atrial size was mildly dilated.  5. The mitral valve is degenerative. Mild mitral valve regurgitation.  Moderate mitral annular calcification.  6. The aortic valve is tricuspid. Aortic valve regurgitation is trivial.  Mild to moderate aortic valve stenosis.  7. There is mild (Grade II) atheroma plaque involving the aortic root and  ascending aorta.  8. The inferior vena cava is normal in size with greater  than 50%  respiratory variability, suggesting right atrial pressure  of 3 mmHg.   FINDINGS  Left Ventricle: Left ventricular ejection fraction, by estimation, is 70  to 75%. The left ventricle has hyperdynamic function. The left ventricle  has no regional wall motion abnormalities. Definity contrast agent was  given IV to delineate the left  ventricular endocardial borders. The left ventricular internal cavity size  was normal in size. There is moderate concentric left ventricular  hypertrophy. Left ventricular diastolic parameters are consistent with  Grade I diastolic dysfunction (impaired  relaxation).   Right Ventricle: The right ventricular size is normal. No increase in  right ventricular wall thickness. Right ventricular systolic function is  normal.   Left Atrium: Left atrial size was mildly dilated.   Right Atrium: Right atrial size was mildly dilated.   Pericardium: There is no evidence of pericardial effusion.   Mitral Valve: The mitral valve is degenerative in appearance. There is  mild thickening of the mitral valve leaflet(s). There is mild  calcification of the mitral valve leaflet(s). Moderate mitral annular  calcification. Mild mitral valve regurgitation.   Tricuspid Valve: The tricuspid valve is normal in structure. Tricuspid  valve regurgitation is mild.   Aortic Valve: The aortic valve is tricuspid. Aortic valve regurgitation is  trivial. Mild to moderate aortic stenosis is present. Aortic valve mean  gradient measures 15.0 mmHg. Aortic valve peak gradient measures 28.5  mmHg.   Pulmonic Valve: The pulmonic valve was normal in structure. Pulmonic valve  regurgitation is not visualized.   Aorta: The aortic root is normal in size and structure. There is mild  (Grade II) atheroma plaque involving the aortic root and ascending aorta.   Venous: The inferior vena cava is normal in size with greater than 50%  respiratory variability, suggesting right atrial pressure of 3 mmHg.   IAS/Shunts: The interatrial septum was not well  visualized.     LEFT VENTRICLE  PLAX 2D  LVIDd:     5.20 cm   Diastology  LVIDs:     3.80 cm   LV e' medial:  3.68 cm/s  LV PW:     1.70 cm   LV E/e' medial: 21.3  LV IVS:    1.70 cm   LV e' lateral:  4.13 cm/s  LVOT diam:   2.60 cm   LV E/e' lateral: 19.0  LVOT Area:   5.31 cm    LV Volumes (MOD)  LV vol d, MOD A2C: 135.0 ml  LV vol d, MOD A4C: 202.0 ml  LV vol s, MOD A2C: 32.1 ml  LV vol s, MOD A4C: 70.4 ml  LV SV MOD A2C:   102.9 ml  LV SV MOD A4C:   202.0 ml  LV SV MOD BP:   112.8 ml   RIGHT VENTRICLE  RV S prime:   14.90 cm/s  TAPSE (M-mode): 2.0 cm   LEFT ATRIUM       RIGHT ATRIUM  LA diam:    5.00 cm RA Area:   15.10 cm  LA Vol (A2C):  78.9 ml RA Volume:  38.90 ml  LA Vol (A4C):  90.0 ml  LA Biplane Vol: 83.3 ml  AORTIC VALVE  AV Vmax:   267.00 cm/s  AV Vmean:   176.667 cm/s  AV VTI:    0.594 m  AV Peak Grad: 28.5 mmHg  AV Mean Grad: 15.0 mmHg    AORTA  Ao Root diam: 3.80 cm  Ao Asc diam: 3.50 cm  MITRAL VALVE  MV Area (PHT): 2.80 cm  SHUNTS  MV Decel Time: 271 msec  Systemic Diam: 2.60 cm  MV E velocity: 78.40 cm/s  MV A velocity: 98.10 cm/s  MV E/A ratio: 0.80   Orpah Cobb MD  Electronically signed by Orpah Cobb MD  Signature Date/Time: 03/29/2020/8:47:57 AM     TRANSESOPHOGEAL ECHO REPORT       Patient Name:  Charles [name] Date of Exam: 03/29/2020  Medical Rec #: [id number]      Height:  Accession #:  [id number]      Weight:  Date of Birth: [birth year]      BSA:  Patient Age:  74 years       BP:      188/82 mmHg  Patient Gender: M          HR:      74 bpm.  Exam Location: Inpatient   Procedure: Transesophageal Echo, 3D Echo, Color Doppler and Cardiac  Doppler   Indications:  Aortic Stenosis    History:    Patient has prior history of Echocardiogram examinations,  most          recent 03/29/2020. Risk Factors:Hypertension and ETOH.    Sonographer:  Eulah Pont RDCS  Referring Phys: [id number] Orpah Cobb   PROCEDURE: After discussion of the risks and benefits of a TEE, an  informed consent was obtained from the patient. The transesophogeal probe  was passed without difficulty through the esophogus of the patient.  Sedation performed by different physician.  The patient was monitored while under deep sedation. Anesthestetic  sedation was provided intravenously by Anesthesiology: 319mg  of Propofol,  100mg  of Lidocaine. The patient's vital signs; including heart rate, blood  pressure, and oxygen saturation;  remained stable throughout the procedure. The patient developed no  complications during the procedure.   IMPRESSIONS    1. Left ventricular ejection fraction, by estimation, is 70 to 75%. The  left ventricle has hyperdynamic function. The left ventricle has no  regional wall motion abnormalities. There is severe concentric left  ventricular hypertrophy.  2. Right ventricular systolic function is normal. The right ventricular  size is normal.  3. Left atrial size was moderately dilated. No left atrial/left atrial  appendage thrombus was detected.  4. Systolic anterior leaflet motion. The mitral valve is degenerative.  Moderate mitral valve regurgitation. No evidence of mitral stenosis.  5. The aortic valve is calcified. There is severe calcifcation of the  aortic valve. There is severe thickening of the aortic valve. Aortic valve  regurgitation is mild. Severe aortic valve stenosis.  6. The inferior vena cava is normal in size with greater than 50%  respiratory variability, suggesting right atrial pressure of 3 mmHg.   FINDINGS  Left Ventricle: Left ventricular ejection fraction, by estimation, is 70  to 75%. The left ventricle has hyperdynamic function. The left ventricle  has no regional wall motion abnormalities. The left ventricular  internal  cavity size was normal in size.  There is severe concentric left ventricular hypertrophy.   Right Ventricle: The right ventricular size is normal. No increase in  right ventricular wall thickness. Right ventricular systolic function is  normal.   Left Atrium: Left atrial size was moderately dilated. No left atrial/left  atrial appendage thrombus was detected.   Right Atrium: Right atrial size was normal in size.   Pericardium: There is no evidence of pericardial effusion.   Mitral Valve: Systolic anterior leaflet motion. The mitral valve is  degenerative  in appearance. There is moderate thickening of the mitral  valve leaflet(s). There is moderate calcification of the mitral valve  leaflet(s). Mild mitral annular  calcification. Moderate mitral valve regurgitation, with  centrally-directed jet. No evidence of mitral valve stenosis.   Tricuspid Valve: The tricuspid valve is normal in structure. Tricuspid  valve regurgitation is mild.   Aortic Valve: The aortic valve is calcified. There is severe calcifcation  of the aortic valve. There is severe thickening of the aortic valve. There  is moderate aortic valve annular calcification. Aortic valve regurgitation  is mild. Severe aortic  stenosis is present. Aortic valve mean gradient measures 30.0 mmHg. Aortic  valve peak gradient measures 65.0 mmHg.   Pulmonic Valve: The pulmonic valve was normal in structure. Pulmonic valve  regurgitation is not visualized.   Aorta: The aortic root is normal in size and structure. There is minimal  (Grade I) atheroma plaque involving the aortic root, ascending aorta and  descending aorta.   Venous: The left lower pulmonary vein and right lower pulmonary vein are  normal. The inferior vena cava is normal in size with greater than 50%  respiratory variability, suggesting right atrial pressure of 3 mmHg.   IAS/Shunts: No atrial level shunt detected by color flow Doppler.   Additional  Comments: A device lead is visualized in the right atrium and  superior vena cava.     AORTIC VALVE  AV Vmax:      403.00 cm/s  AV Vmean:     249.000 cm/s  AV VTI:      0.763 m  AV Peak Grad:   65.0 mmHg  AV Mean Grad:   30.0 mmHg  LVOT Vmax:     205.00 cm/s  LVOT Vmean:    103.000 cm/s  LVOT VTI:     0.226 m  LVOT/AV VTI ratio: 0.30     SHUNTS  Systemic VTI: 0.23 m   Orpah Cobb MD  Electronically signed by Orpah Cobb MD  Signature Date/Time: 03/29/2020/7:19:03 PM     Physicians  Panel Physicians Referring Physician Case Authorizing Physician  Orpah Cobb, MD (Primary)      Procedures  LEFT HEART CATH AND CORONARY ANGIOGRAPHY   Conclusion    Mid RCA lesion is 40% stenosed.  Previously placed Mid LAD stent (unknown type) is widely patent.   AV replacement +/- HOCM treatment Surgery or TAVR.    Recommendations  Antiplatelet/Anticoag No indication for antiplatelet therapy at this time .   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  CLINICAL HISTORY:  This is a 75 years old male with CAD, LAD stent, severe AS has chest pain and exertional dyspnea..  The risks, benefits, and details of the procedure were explained to the patient.  The patient verbalized understanding and wanted to proceed.  Informed written consent was obtained.  PROCEDURE TECHNIQUE:  The patient was approached from the right femoral artery using a 5 French short sheath.  Left  coronary angiography was done using a Judkins L4 guide catheter.  Right coronary angiography was done using a Judkins R4 guide catheter.  Left ventriculography was not done  using a pigtail catheter.    CONTRAST:  Total of 60 cc.  Total sedation time : - 26 mins.  I was present 100 % of the time face to face during the procedure.  Estimated blood loss <50 mL.   During this procedure medications were administered to achieve and maintain moderate conscious sedation while the patient's heart rate, blood pressure, and oxygen saturation were continuously monitored and I was present face-to-face 100% of this time.   Medications (Filter: Administrations occurring from 1020 to 1135 on 03/30/20) (important) Continuous medications are totaled by the amount administered until 03/30/20 1135.    midazolam (VERSED) injection (mg) Total dose:  2 mg  Date/Time Rate/Dose/Volume Action   03/30/20 1059 1 mg Given   1104 1 mg Given    fentaNYL (SUBLIMAZE) injection (mcg) Total dose:  50 mcg  Date/Time Rate/Dose/Volume Action   03/30/20 1059 25 mcg Given   1104 25 mcg Given    lidocaine (PF) (XYLOCAINE) 1 % injection (mL) Total volume:  15 mL  Date/Time Rate/Dose/Volume Action   03/30/20 1104 15 mL Given    iodixanol (VISIPAQUE) 320 MG/ML injection (mL) Total volume:  60 mL  Date/Time Rate/Dose/Volume Action   03/30/20 1127 60 mL Given    Sedation Time  Sedation Time Physician-1: 25 minutes 46 seconds   Contrast  Medication Name Total Dose  iodixanol (VISIPAQUE) 320 MG/ML injection 60 mL    Radiation/Fluoro  Fluoro time: 5.2 (min) DAP: 50196 (mGycm2) Cumulative Air Kerma: 554 (mGy)   Complications   Complications documented before study signed (03/30/2020 12:00 PM)    No complications were associated with this study.  Documented by Orpah Cobb, MD - 03/30/2020 11:53 AM     Coronary Findings   Diagnostic Dominance: Right  Left Main  Vessel was injected. Vessel  is normal in caliber. Vessel is angiographically normal. Very short vessel  Left Anterior Descending  Vessel was injected. Vessel is moderate in size. Vessel is angiographically normal.  Previously placed Mid LAD stent (unknown type) is widely patent. Vessel is not the culprit lesion. The lesion is type A. Previously placed stent displays no rethrombosis. Previously placed stent displays no restenosis. The stenosis was measured by a visual reading. Pressure wire/FFR was not performed on the lesion. IVUS was not performed. No optical coherence tomography (OCT) was performed.  Left Circumflex  Vessel was injected. Vessel is normal in caliber. Vessel is angiographically normal.  Right Coronary Artery  Vessel was injected. Vessel is normal in caliber. Vessel is angiographically normal.  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 Vessel is not the culprit lesion. The lesion is type A and eccentric. The lesion was not previously treated. The stenosis was measured by a visual reading. Pressure wire/FFR was not performed on the lesion. IVUS was not performed. No optical coherence tomography (OCT) was performed.   Intervention   No interventions have been documented.  Wall Motion  Resting       All segments of the heart are normal.  Severe LVH, OT obstruction and normal LV systolic function.           Coronary Diagrams   Diagnostic Dominance: Right    Intervention    Implants    No implant documentation for this case.   Syngo Images  Show images for CARDIAC CATHETERIZATION  Images on Long Term Storage  Show images for Charles [name]  Link to Procedure Log  Procedure Log     Hemo Data  Flowsheet Row Most  Recent Value  AO Systolic Pressure 108 mmHg  AO Diastolic Pressure 55 mmHg  AO Mean 77 mmHg    ADDENDUM REPORT: 04/16/2020 09:27  CLINICAL DATA:  Pre-op transcatheter aortic valve replacement (TAVR)  EXAM: Cardiac TAVR CT  TECHNIQUE: The patient was  scanned on a Siemens Force 192 slice scanner. A 120 kV retrospective scan was triggered in the descending thoracic aorta at 111 HU's. Gantry rotation speed was 270 msecs and collimation was .9 mm. The 3D data set was reconstructed in 5% intervals of the R-R cycle. Systolic and diastolic phases were analyzed on a dedicated work station using MPR, MIP and VRT modes. The patient received OMNIPAQUE IOHEXOL 350 MG/ML SOLN of contrast.  FINDINGS: Aortic Valve: Tricuspid aortic valve. Moderate-severely reduced cusp separation. Moderately thickened, severely calcified aortic valve cusps.  AV calcium score: 2314  Virtual Basal Annulus Measurements:  Maximum/Minimum Diameter: 30.6 x 22.7 mm  Perimeter: 85.2 mm  Area: 551 mm2  Trivial LVOT calcifications below non-coronary cusp.  Based on these measurements, the annulus would be suitable for a 29 mm Sapien 3 valve.  Sinus of Valsalva Measurements:  Non-coronary:  36 mm  Right - coronary:  35 mm  Left - coronary:  37 mm  Sinus of Valsalva Height:  Left: 21.2 mm  Right: 24 mm  Aorta: Moderate thoracic aorta calcifications. Conventional three vessel branch pattern of aortic arch.  Sinotubular Junction:  28 mm  Ascending Thoracic Aorta:  34 mm  Aortic Arch:  29 mm  Descending Thoracic Aorta:  27 mm  Coronary Artery Height above Annulus:  Left Main: 16.1 mm  Right Coronary: 21 mm  Coronary Arteries: 3 vessel coronary artery calcifications  Optimum Fluoroscopic Angle for Delivery: LAO 13, CAU 14  Dual chamber pacemaker in place, leads seen in RA and RV.  No LA appendage thrombus.  IMPRESSION: 1. Tricuspid aortic valve. Moderate-severely reduced cusp separation. Moderately thickened, severely calcified aortic valve cusps.  2.  AV calcium score: 2314  3.  Annulus area: 551 mm2, suitable for 29 mm Sapien 3 valve.  4.  Adequate coronary artery height from annulus.  5.  Normal caliber thoracic aorta. Moderate aortic atherosclerosis and calcification.  6. Optimum Fluoroscopic Angle for Delivery: LAO 13, CAU 14   Electronically Signed   By: Weston Brass   On: 04/16/2020 09:27  Narrative & Impression  CLINICAL DATA:  75 year old male with history of severe aortic stenosis. Preprocedural study prior to potential transcatheter aortic valve replacement (TAVR).  EXAM: CT ANGIOGRAPHY CHEST, ABDOMEN AND PELVIS  TECHNIQUE: Multidetector CT imaging through the chest, abdomen and pelvis was performed using the standard protocol during bolus administration of intravenous contrast. Multiplanar reconstructed images and MIPs were obtained and reviewed to evaluate the vascular anatomy.  CONTRAST:  OMNIPAQUE IOHEXOL 350 MG/ML SOLN  COMPARISON:  Chest CTA 03/29/2020.  FINDINGS: CTA CHEST FINDINGS  Cardiovascular: Heart size is borderline enlarged. There is no significant pericardial fluid, thickening or pericardial calcification. There is aortic atherosclerosis, as well as atherosclerosis of the great vessels of the mediastinum and the coronary arteries, including calcified atherosclerotic plaque in the left main, left anterior descending, left circumflex and right coronary arteries. Severe thickening and calcification of the aortic valve. Moderate calcifications of the mitral annulus. Left-sided pacemaker/AICD with lead tips terminating in the right atrium and right ventricular apex.  Mediastinum/Lymph Nodes: Multiple prominent borderline enlarged mediastinal lymph nodes are noted, but nonspecific. No hilar lymphadenopathy. Esophagus is unremarkable in appearance. No axillary lymphadenopathy.  Lungs/Pleura: No suspicious appearing pulmonary nodules or masses are noted. No acute consolidative airspace disease. No pleural effusions. Areas of mild bibasilar scarring.  Musculoskeletal/Soft Tissues: Orthopedic fixation hardware in  the cervical spine. There are no aggressive appearing lytic or blastic lesions noted in the visualized portions of the skeleton.  CTA ABDOMEN AND PELVIS FINDINGS  Hepatobiliary: Diffuse low attenuation throughout the hepatic parenchyma, indicative of hepatic steatosis. No suspicious cystic or solid hepatic lesions. No intra or extrahepatic biliary ductal dilatation. Gallbladder is normal in appearance.  Pancreas: No pancreatic mass. No pancreatic ductal dilatation. No pancreatic or peripancreatic fluid collections or inflammatory changes.  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 Otherwise, unremarkable.  Adrenals/Urinary Tract: 1.3 cm intermediate attenuation lesion in the interpolar region of the left kidney, incompletely characterized on today's examination. Subcentimeter low-attenuation lesions in the lower pole of the right kidney, too small to characterize, but statistically likely to represent cysts. Multifocal cortical thinning and mild generalized parenchymal atrophy in both kidneys. Bilateral adrenal glands are normal in appearance. No hydroureteronephrosis. Urinary bladder is unremarkable in appearance.  Stomach/Bowel: The appearance of the stomach is normal. No pathologic dilatation of small bowel or colon. Normal appendix.  Vascular/Lymphatic: Aortic atherosclerosis, without evidence of aneurysm or dissection in the abdominal or pelvic vasculature. No lymphadenopathy noted in the abdomen or pelvis.  Reproductive: Prostate gland and seminal vesicles are unremarkable in appearance.  Other: Esophagus is unremarkable in appearance. No axillary lymphadenopathy.  Musculoskeletal: There are no aggressive appearing lytic or blastic lesions noted in the visualized portions of the skeleton.  VASCULAR MEASUREMENTS PERTINENT TO TAVR:  AORTA:  Minimal Aortic Diameter-17 x 14 mm  Severity of Aortic  Calcification-moderate  RIGHT PELVIS:  Right Common Iliac Artery -  Minimal Diameter-10.7 x 10.5 mm  Tortuosity-mild  Calcification-mild  Right External Iliac Artery -  Minimal Diameter-8.1 x 8.7 mm  Tortuosity-moderate to severe  Calcification-none  Right Common Femoral Artery -  Minimal Diameter-6.8 x 8.7 mm  Tortuosity-mild  Calcification-mild  LEFT PELVIS:  Left Common Iliac Artery -  Minimal Diameter-11.5 x 8.9 mm  Tortuosity-mild  Calcification-mild  Left External Iliac Artery -  Minimal Diameter-8.5 x 8.0 mm  Tortuosity-severe  Calcification-none  Left Common Femoral Artery -  Minimal Diameter-8.3 x 5.4 mm  Tortuosity-mild  Calcification-mild  Review of the MIP images confirms the above findings.  IMPRESSION: 1. Vascular findings and measurements pertinent to potential TAVR procedure, as detailed above. 2. Severe thickening calcification of the aortic valve, compatible with reported clinical history of severe aortic stenosis. 3. Aortic atherosclerosis, in addition to left main and 3 vessel coronary artery disease. Assessment for potential risk factor modification, dietary therapy or pharmacologic therapy may be warranted, if clinically indicated. 4. Hepatic steatosis. 5. Indeterminate lesion in the interpolar region of the left kidney. This may simply represent a small proteinaceous/hemorrhagic cyst. This could be definitively characterized with follow-up nonemergent abdominal MRI with and without IV gadolinium if of clinical concern. Alternatively, attention on follow-up hematuria protocol CT scan should be considered in 6 months. 6. Additional incidental findings, as above.   Electronically Signed   By: Trudie Reed M.D.   On: 04/14/2020 07:08    STS Risk Calculator: Isolated AVR: Risk of Mortality: 1.636% Renal Failure: 3.324% Permanent Stroke: 1.069% Prolonged  Ventilation: 5.170% DSW Infection: 0.099% Reoperation: 4.496% Morbidity or Mortality: 9.741% Short Length of Stay: 42.523% Long Length of Stay: 4.084%  Impression:  This 75 year old gentleman has stage D, moderate to severe,  symptomatic aortic stenosis with New York Heart Association class II-[name] symptoms of exertional fatigue and shortness of breath consistent with chronic diastolic congestive heart failure.  I personally reviewed his 2D echocardiogram, TEE, cardiac catheterization, and CTA studies.  His 2D echo shows a trileaflet aortic valve with severe calcification and thickening of the leaflets.  The mean gradient was measured at 15 mmHg with a peak gradient of 28.5 mmHg.  There is severe concentric left ventricular hypertrophy with a 1.7 cm posterior wall and interventricular septum.  TEE confirmed a trileaflet aortic valve that is severely calcified and thickened.  The aortic valve mean gradient was measured at 30 mmHg with a peak gradient of 65 mmHg.  This also shows severe concentric LVH with systolic anterior motion of the mitral valve and moderate mitral regurgitation.  The anterior leaflet mitral valve appears to contact the interventricular septum at the end of systole.  This could be contributing to the mitral regurgitation and to his symptoms.  Cardiac catheterization shows mild nonobstructive disease in the RCA.  The aortic valve was not crossed and neither the echo or cath studies evaluated any gradient across the left ventricular outflow tract. I agree that aortic valve replacement is indicated in this patient but our multidisciplinary heart valve team will need to review the studies and decide on the best way to treat his severe LVH with systolic anterior motion of mitral valve.  He does not have a distinct septal bulge impinging on the LVOT that would be amenable to straightforward septal myomectomy.  I discussed transcatheter aortic valve replacement and open surgical aortic  valve replacement with the patient and his son.  He would really like to avoid open surgery if at all possible.  I told him we would discuss his case at our multidisciplinary heart valve meeting next week.   Plan:  He will be discussed at our multidisciplinary heart valve meeting to decide about proceeding with transcatheter aortic valve replacement versus open surgical aortic valve placement with septal myomectomy.  I spent 60 minutes performing this consultation and > 50% of this time was spent face to face counseling and coordinating the care of this patient's severe symptomatic aortic stenosis    Alleen BorneBryan K. Brea Coleson, MD 04/17/2020 2:54 PM

## 2020-04-20 ENCOUNTER — Emergency Department (HOSPITAL_COMMUNITY)
Admission: EM | Admit: 2020-04-20 | Discharge: 2020-04-21 | Disposition: A | Discharge status: Home / Self Care | Payer: Medicare HMO | Attending: Emergency Medicine | Admitting: Emergency Medicine

## 2020-04-20 ENCOUNTER — Emergency Department (HOSPITAL_COMMUNITY): Payer: Medicare HMO

## 2020-04-20 ENCOUNTER — Encounter (HOSPITAL_COMMUNITY): Payer: Self-pay

## 2020-04-20 DIAGNOSIS — J189 Pneumonia, unspecified organism: Secondary | ICD-10-CM

## 2020-04-20 DIAGNOSIS — Z79899 Other long term (current) drug therapy: Secondary | ICD-10-CM | POA: Diagnosis not present

## 2020-04-20 DIAGNOSIS — R0789 Other chest pain: Secondary | ICD-10-CM | POA: Diagnosis not present

## 2020-04-20 DIAGNOSIS — I10 Essential (primary) hypertension: Secondary | ICD-10-CM | POA: Insufficient documentation

## 2020-04-20 DIAGNOSIS — R079 Chest pain, unspecified: Secondary | ICD-10-CM

## 2020-04-20 DIAGNOSIS — Z955 Presence of coronary angioplasty implant and graft: Secondary | ICD-10-CM | POA: Insufficient documentation

## 2020-04-20 DIAGNOSIS — Z87891 Personal history of nicotine dependence: Secondary | ICD-10-CM | POA: Insufficient documentation

## 2020-04-20 DIAGNOSIS — Z7982 Long term (current) use of aspirin: Secondary | ICD-10-CM | POA: Diagnosis not present

## 2020-04-20 HISTORY — DX: Pneumonia, unspecified organism: J18.9

## 2020-04-20 LAB — BASIC METABOLIC PANEL
Anion gap: 9 (ref 5–15)
BUN: 21 mg/dL (ref 8–23)
CO2: 23 mmol/L (ref 22–32)
Calcium: 10 mg/dL (ref 8.9–10.3)
Chloride: 106 mmol/L (ref 98–111)
Creatinine, Ser: 1.19 mg/dL (ref 0.61–1.24)
GFR, Estimated: >60 mL/min (ref >60)
Glucose, Bld: 95 mg/dL (ref 70–99)
Potassium: 4.6 mmol/L (ref 3.5–5.1)
Sodium: 138 mmol/L (ref 135–145)

## 2020-04-20 LAB — TROPONIN I (HIGH SENSITIVITY)
Troponin I (High Sensitivity): 20 ng/L — ABNORMAL HIGH (ref <18)
Troponin I (High Sensitivity): 24 ng/L — ABNORMAL HIGH (ref <18)

## 2020-04-20 LAB — CBC
HCT: 41.4 % (ref 39.0–52.0)
Hemoglobin: 13.8 g/dL (ref 13.0–17.0)
MCH: 30.7 pg (ref 26.0–34.0)
MCHC: 33.3 g/dL (ref 30.0–36.0)
MCV: 92 fL (ref 80.0–100.0)
Platelets: 245 10*3/uL (ref 150–400)
RBC: 4.5 MIL/uL (ref 4.22–5.81)
RDW: 13 % (ref 11.5–15.5)
WBC: 5.8 10*3/uL (ref 4.0–10.5)
nRBC: 0 % (ref 0.0–0.2)

## 2020-04-20 NOTE — ED Provider Notes (Addendum)
MSE was initiated and I personally evaluated the patient and placed orders (if any) at  5:55 PM on April 20, 2020.  The patient appears stable so that the remainder of the MSE may be completed by another provider.  Patient w known valvular disease p/w CP / SOB that began while doing yoga. Currently he is improved.  ECG reviewed AV-paced.    Gerhard Munch, MD 04/20/20 1756    Gerhard Munch, MD 04/20/20 [phone]

## 2020-04-20 NOTE — ED Triage Notes (Signed)
Pt arrives from EMS, EMS reports pt having chest pain doing yoga  starting about an hour and half ago radiating to left ear and left shoulder. EMS states pt was given 324 Aspirin and 1 NTG where pt then became  Hypotensive after NTG was given. Ems reports pt has an hx of Pacemaker and suppose to have Mitral valve replacement on 4/12   130/70 HR 72

## 2020-04-21 ENCOUNTER — Encounter (HOSPITAL_COMMUNITY): Payer: Self-pay | Admitting: Student

## 2020-04-21 NOTE — ED Provider Notes (Addendum)
[facility] EMERGENCY DEPARTMENT Provider Note   CSN: [id number] Arrival date & time: 04/20/20  1742     History Chief Complaint  Patient presents with  . Chest Pain    Charles [name] is a 75 y.o. male with a hx of hypertension, hyperlipidemia, severe aortic stenosis, S/p pacemaker placement, & alcohol abuse who [facility] for chest pain that occurred last night and is now resolved. Patient states he was participating in yoga when he developed chest discomfort described as an ache to the left chest radiating to the left shoulder/ear prompting ED visit. The discomfort lasted about 1.5 hours and resolved, has not re-occurred. Currently is asymptomatic. Pain feels similar to prior ED visit discomforts. He is planned to have TAVR 05/01/20. Denies lightheadedness, dizziness, dyspnea, nausea, vomiting, diaphoresis, or syncope.   HPI     Past Medical History:  Diagnosis Date  . Alcohol abuse   . Depression   . HLD (hyperlipidemia)   . Hypertension   . S/P placement of cardiac pacemaker   . Severe aortic stenosis     Patient Active Problem List   Diagnosis Date Noted  . Severe aortic stenosis   . Hypertension   . HLD (hyperlipidemia)   . S/P placement of cardiac pacemaker   . Alcohol abuse   . Acute coronary syndrome (HCC) 03/29/2020    Past Surgical History:  Procedure Laterality Date  . BUBBLE STUDY  03/29/2020   Procedure: BUBBLE STUDY;  Surgeon: Orpah Cobb, MD;  Location: [facility] ENDOSCOPY;  Service: Cardiovascular;;  . CERVICAL FUSION    . FOOT SURGERY    . INSERT / REPLACE / REMOVE PACEMAKER    . LEFT HEART CATH AND CORONARY ANGIOGRAPHY N/A 03/30/2020   Procedure: LEFT HEART CATH AND CORONARY ANGIOGRAPHY;  Surgeon: Orpah Cobb, MD;  Location: [facility] INVASIVE CV LAB;  Service: Cardiovascular;  Laterality: N/A;  . TEE WITHOUT CARDIOVERSION  03/29/2020  . TEE WITHOUT CARDIOVERSION N/A 03/29/2020   Procedure: TRANSESOPHAGEAL  ECHOCARDIOGRAM (TEE);  Surgeon: Orpah Cobb, MD;  Location: [facility] ENDOSCOPY;  Service: Cardiovascular;  Laterality: N/A;       History reviewed. No pertinent family history.  Social History   Tobacco Use  . Smoking status: Former Games developer  . Smokeless tobacco: Never Used  Vaping Use  . Vaping Use: Never used  Substance Use Topics  . Alcohol use: Yes    Comment: 3/4 a 5th daily  . Drug use: Never    Home Medications Prior to Admission medications   Medication Sig Start Date End Date Taking? Authorizing Provider  aspirin 81 MG EC tablet Take 81 mg by mouth daily. 12/07/19   [provider]  atorvastatin (LIPITOR) 40 MG tablet Take 40 mg by mouth daily. 09/30/19   [provider]  Cholecalciferol 25 MCG (1000 UT) capsule Take 2,000 Units by mouth daily.    [provider]  DULoxetine HCl 60 MG CSDR Take 60 mg by mouth daily. 03/26/20   [provider]  furosemide (LASIX) 20 MG tablet Take 20 mg by mouth daily.    [provider]  lisinopril (ZESTRIL) 20 MG tablet Take 20 mg by mouth daily.    [provider]  metoprolol tartrate (LOPRESSOR) 50 MG tablet Take 1 tablet (50 mg total) by mouth 2 (two) times daily. 03/31/20   Rinaldo Cloud, MD  Multiple Vitamins-Minerals [facility] COMPLETE) CAPS Take 1 capsule by mouth daily.    [provider]  nitroGLYCERIN (NITROSTAT) 0.4 MG SL tablet Place 0.4 mg under the tongue every 5 (five) minutes as needed for chest pain (max 3 doses).    [provider]  omeprazole (PRILOSEC) 20 MG capsule Take 20 mg by mouth at bedtime. 03/07/20   [provider]  thiamine 100 MG tablet Take 100 mg by mouth daily.    [provider]  traZODone (DESYREL) 50 MG tablet Take 50 mg by mouth at bedtime as needed for sleep. 02/10/20   [provider]  vitamin C (ASCORBIC ACID) 500 MG tablet Take 500 mg by mouth daily.    [provider]    Allergies     Shellfish-derived products, Gabapentin, and Tramadol  Review of Systems   Review of Systems  Constitutional: Negative for chills and fever.  Respiratory: Negative for shortness of breath.   Cardiovascular: Positive for chest pain (resolved).  Gastrointestinal: Negative for abdominal pain, nausea and vomiting.  Neurological: Negative for dizziness, syncope, weakness and light-headedness.  All other systems reviewed and are negative.   Physical Exam Updated Vital Signs BP (!) 184/90 (BP Location: Right Arm)   Pulse 80   Temp 97.6 F (36.4 C) (Oral)   Resp (!) 21   SpO2 98%   Physical Exam Vitals and nursing note reviewed.  Constitutional:      General: He is not in acute distress.    Appearance: He is well-developed. He is not toxic-appearing.  HENT:     Head: Normocephalic and atraumatic.  Eyes:     General:        Right eye: No discharge.        Left eye: No discharge.     Conjunctiva/sclera: Conjunctivae normal.  Cardiovascular:     Rate and Rhythm: Normal rate and regular rhythm.     Pulses:          Radial pulses are 2+ on the right side and 2+ on the left side.     Heart sounds: Murmur heard.    Pulmonary:     Effort: Pulmonary effort is normal. No respiratory distress.     Breath sounds: Normal breath sounds. No wheezing, rhonchi or rales.  Abdominal:     General: There is no distension.     Palpations: Abdomen is soft.     Tenderness: There is no abdominal tenderness.  Musculoskeletal:     Cervical back: Neck supple.  Skin:    General: Skin is warm and dry.     Findings: No rash.  Neurological:     Mental Status: He is alert.     Comments: Clear speech.   Psychiatric:        Behavior: Behavior normal.     ED Results / Procedures / Treatments   Labs (all labs ordered are listed, but only abnormal results are displayed) Labs Reviewed  TROPONIN I (HIGH SENSITIVITY) - Abnormal; Notable for the following components:      Result Value   Troponin I  (High Sensitivity) 20 (*)    All other components within normal limits  TROPONIN I (HIGH SENSITIVITY) - Abnormal; Notable for the following components:   Troponin I (High Sensitivity) 24 (*)    All other components within normal limits  BASIC METABOLIC PANEL  CBC    EKG EKG Interpretation  Date/Time:  Saturday April 21 2020 02:38:02 EDT Ventricular Rate:  62 PR Interval:  118 QRS Duration: 128 QT Interval:  462 QTC Calculation: 468 R Axis:   -1 Text Interpretation:  Normal sinus rhythm Left ventricular hypertrophy with QRS widening and repolarization abnormality ( R in aVL , Sokolow-Lyon , Cornell product , Romhilt-Estes ) Abnormal ECG Confirmed by Zadie Rhine ([id number]) on 04/21/2020 2:46:46 AM   Radiology DG Chest 2 View  Result Date: 04/20/2020 CLINICAL DATA:  Chest pain EXAM: CHEST - 2 VIEW COMPARISON:  03/28/2020 FINDINGS: Surgical hardware in the cervical spine. Left-sided pacing device with leads over the right atrium and right ventricle. Atelectasis or scarring at the left base. Stable mild elevation right diaphragm. No consolidation or effusion. Normal heart size. No pneumothorax IMPRESSION: No active cardiopulmonary disease. Scarring or atelectasis at the left base. Electronically Signed   By: Jasmine Pang M.D.   On: 04/20/2020 18:54    Procedures Procedures   Medications Ordered in ED Medications - No data to display  ED Course  I have reviewed the triage vital signs and the nursing notes.  Pertinent labs & imaging results that were available during my care of the patient were reviewed by me and considered in my medical decision making (see chart for details).    MDM Rules/Calculators/A&P                         Patient presents to the emergency department with chest pain. Patient nontoxic appearing, in no apparent distress, vitals with elevated BP- doubt HTN emergency. Exam benign.   Prior heart catheterization reviewed from 03/30/20 (admission): Noted to have  mild nonobstructive coronary artery disease on cath, severe AS, referred to CV surgery- plan for TAVR 05/01/20.   Additional history obtained:  Additional history obtained from chart review & nursing note review.   EKG: No STEMI.   Lab Tests:  I Ordered, reviewed, and interpreted labs, which included:  CBC: Unremarkable. BMP: Unremarkable. Troponin: No significant elevation, similar to patient's prior troponins.  Imaging Studies ordered:  CXR ordered by triage, I independently reviewed, formal radiology impression shows: No active cardiopulmonary disease. Scarring or atelectasis at the left base.   ED Course:  Troponins without significant elevation, similar to prior, EKG without findings of STEMI, recent cardiac catheterization with nonobstructive CAD, have a low suspicion for ACS.  Patient is low risk Wells, low suspicion for pulmonary embolism.  No widened mediastinum on chest x-ray, symmetric pulses, doubt dissection.  Chest x-ray is also without pneumothorax, pneumonia, or pulmonary edema.  Patient's discomfort is similar to prior episodes, resolved, has not reoccurred, he is scheduled for TAVR procedure within the next 2 weeks.  Given he is not having any dizziness, lightheadedness, or syncopal episodes and that his chest pain is resolved and has not reoccurred feel he is appropriate for discharge home. I discussed results, treatment plan, need for follow-up, and return precautions with the patient. Provided opportunity for questions, patient confirmed understanding and is in agreement with plan.   Findings and plan of care discussed with supervising physician Dr. Jacqulyn Bath who has evaluated the patient & is in agreement.   Blood pressure (!) 147/74, pulse 74, temperature 98 F (36.7 C), temperature source Oral, resp. rate 15, SpO2 96 %.  Portions of this note were generated with Scientist, clinical (histocompatibility and immunogenetics). Dictation errors may occur despite best attempts at proofreading.  Final Clinical  Impression(s) / ED Diagnoses Final diagnoses:  Chest pain, unspecified type    Rx / DC Orders ED Discharge Orders    None       Cherly Anderson, PA-C 04/21/20 0802    Ashlin Kreps, Pleas Koch, PA-C  04/21/20 0802    Maia PlanLong, Joshua G, MD 04/24/20 1009

## 2020-04-21 NOTE — ED Notes (Signed)
Pt from [facility]. This RN gave Alvino Chapel, RN nursing report. [facility] sending transport to ED for pt discharge.

## 2020-04-21 NOTE — Discharge Instructions (Addendum)
You were seen in the emergency department today for chest pain. Your work-up in the emergency department has been overall reassuring. Your labs have been fairly normal and or similar to previous blood work you have had done. Your EKG and the enzyme we use to check your heart did not show an acute heart attack at this time. Your chest x-ray was normal.   We would like you to follow up closely with your cardiologist/surgeon within 1-3 days- call the office Monday morning. Do not participate in strenuous activity until you have followed up. Return to the ER immediately should you experience any new or worsening symptoms including but not limited to return of pain, worsened pain, vomiting, shortness of breath, dizziness, lightheadedness, passing out, or any other concerns that you may have.    Results for orders placed or performed during the hospital encounter of 04/20/20  Basic metabolic panel  Result Value Ref Range   Sodium 138 135 - 145 mmol/L   Potassium 4.6 3.5 - 5.1 mmol/L   Chloride 106 98 - 111 mmol/L   CO2 23 22 - 32 mmol/L   Glucose, Bld 95 70 - 99 mg/dL   BUN 21 8 - 23 mg/dL   Creatinine, Ser 1.61 0.61 - 1.24 mg/dL   Calcium 09.6 8.9 - 04.5 mg/dL   GFR, Estimated >40 >98 mL/min   Anion gap 9 5 - 15  CBC  Result Value Ref Range   WBC 5.8 4.0 - 10.5 K/uL   RBC 4.50 4.22 - 5.81 MIL/uL   Hemoglobin 13.8 13.0 - 17.0 g/dL   HCT 11.9 14.7 - 82.9 %   MCV 92.0 80.0 - 100.0 fL   MCH 30.7 26.0 - 34.0 pg   MCHC 33.3 30.0 - 36.0 g/dL   RDW 56.2 13.0 - 86.5 %   Platelets 245 150 - 400 K/uL   nRBC 0.0 0.0 - 0.2 %  Troponin I (High Sensitivity)  Result Value Ref Range   Troponin I (High Sensitivity) 20 (H) <18 ng/L  Troponin I (High Sensitivity)  Result Value Ref Range   Troponin I (High Sensitivity) 24 (H) <18 ng/L   DG Chest 2 View  Result Date: 04/20/2020 CLINICAL DATA:  Chest pain EXAM: CHEST - 2 VIEW COMPARISON:  03/28/2020 FINDINGS: Surgical hardware in the cervical spine.  Left-sided pacing device with leads over the right atrium and right ventricle. Atelectasis or scarring at the left base. Stable mild elevation right diaphragm. No consolidation or effusion. Normal heart size. No pneumothorax IMPRESSION: No active cardiopulmonary disease. Scarring or atelectasis at the left base. Electronically Signed   By: Jasmine Pang M.D.   On: 04/20/2020 18:54   CT Angio Chest PE W/Cm &/Or Wo Cm  Result Date: 03/29/2020 CLINICAL DATA:  Chest pain, dyspnea, pleurisy EXAM: CT ANGIOGRAPHY CHEST WITH CONTRAST TECHNIQUE: Multidetector CT imaging of the chest was performed using the standard protocol during bolus administration of intravenous contrast. Multiplanar CT image reconstructions and MIPs were obtained to evaluate the vascular anatomy. CONTRAST:  75mL OMNIPAQUE IOHEXOL 350 MG/ML SOLN COMPARISON:  None. FINDINGS: Cardiovascular: There is adequate opacification of the a pulmonary arterial tree. There is no intraluminal filling defect identified to suggest acute pulmonary embolism. The central pulmonary arteries are of normal caliber. There is mild global cardiomegaly with moderate left ventricular hypertrophy noted. Moderate multi-vessel coronary artery calcification. Extensive calcification of the aortic valve leaflets. Mild calcification of the mitral valve annulus. No pericardial effusion. Mild atherosclerotic calcification within the thoracic aorta. The  thoracic aorta is of normal caliber. Left subclavian dual lead pacemaker is seen with leads within the right atrium and right ventricle. Mediastinum/Nodes: The visualized thyroid is unremarkable. Multiple mildly enlarged right paratracheal and subcarinal lymph nodes are identified measuring up to 15 mm in short axis diameter. The esophagus is unremarkable. Lungs/Pleura: There is bibasilar atelectasis, left greater than right. The lungs are otherwise clear. No pneumothorax or pleural effusion. Central airways are widely patent. Upper  Abdomen: At least mild hepatic steatosis noted. Limited images of the upper abdomen are otherwise unremarkable. Musculoskeletal: No acute bone abnormality. Osseous structures are age-appropriate. Review of the MIP images confirms the above findings. IMPRESSION: Mild global cardiomegaly with moderate left ventricular hypertrophy. Moderate multi-vessel coronary artery calcification. Extensive degenerative calcification of the aortic valve leaflets. Echocardiography may be more helpful to assess valvular dysfunction. No pulmonary embolism. Mild hepatic steatosis. Aortic Atherosclerosis (ICD10-I70.0). Electronically Signed   By: Helyn NumbersAshesh  Parikh MD   On: 03/29/2020 05:02   CARDIAC CATHETERIZATION  Result Date: 03/30/2020  Mid RCA lesion is 40% stenosed.  Previously placed Mid LAD stent (unknown type) is widely patent.  AV replacement +/- HOCM treatment Surgery or TAVR.   CT CORONARY MORPH W/CTA COR W/SCORE W/CA W/CM &/OR WO/CM  Addendum Date: 04/16/2020   ADDENDUM REPORT: 04/16/2020 09:27 CLINICAL DATA:  Pre-op transcatheter aortic valve replacement (TAVR) EXAM: Cardiac TAVR CT TECHNIQUE: The patient was scanned on a Siemens Force 192 slice scanner. A 120 kV retrospective scan was triggered in the descending thoracic aorta at 111 HU's. Gantry rotation speed was 270 msecs and collimation was .9 mm. The 3D data set was reconstructed in 5% intervals of the R-R cycle. Systolic and diastolic phases were analyzed on a dedicated work station using MPR, MIP and VRT modes. The patient received 100mL OMNIPAQUE IOHEXOL 350 MG/ML SOLN of contrast. FINDINGS: Aortic Valve: Tricuspid aortic valve. Moderate-severely reduced cusp separation. Moderately thickened, severely calcified aortic valve cusps. AV calcium score: 2314 Virtual Basal Annulus Measurements: Maximum/Minimum Diameter: 30.6 x 22.7 mm Perimeter: 85.2 mm Area: 551 mm2 Trivial LVOT calcifications below non-coronary cusp. Based on these measurements, the annulus  would be suitable for a 29 mm Sapien 3 valve. Sinus of Valsalva Measurements: Non-coronary:  36 mm Right - coronary:  35 mm Left - coronary:  37 mm Sinus of Valsalva Height: Left: 21.2 mm Right: 24 mm Aorta: Moderate thoracic aorta calcifications. Conventional three vessel branch pattern of aortic arch. Sinotubular Junction:  28 mm Ascending Thoracic Aorta:  34 mm Aortic Arch:  29 mm Descending Thoracic Aorta:  27 mm Coronary Artery Height above Annulus: Left Main: 16.1 mm Right Coronary: 21 mm Coronary Arteries: 3 vessel coronary artery calcifications Optimum Fluoroscopic Angle for Delivery: LAO 13, CAU 14 Dual chamber pacemaker in place, leads seen in RA and RV. No LA appendage thrombus. IMPRESSION: 1. Tricuspid aortic valve. Moderate-severely reduced cusp separation. Moderately thickened, severely calcified aortic valve cusps. 2.  AV calcium score: 2314 3.  Annulus area: 551 mm2, suitable for 29 mm Sapien 3 valve. 4.  Adequate coronary artery height from annulus. 5. Normal caliber thoracic aorta. Moderate aortic atherosclerosis and calcification. 6. Optimum Fluoroscopic Angle for Delivery: LAO 13, CAU 14 Electronically Signed   By: Weston BrassGayatri  Acharya   On: 04/16/2020 09:27   Result Date: 04/16/2020 EXAM: OVER-READ INTERPRETATION  CT CHEST The following report is an over-read performed by radiologist Dr. Trudie Reed[facility]iel [facility]kin [facility] on 04/13/2020. This over-read does not include interpretation of cardiac or coronary anatomy  or pathology. The coronary calcium score/coronary CTA interpretation by the cardiologist is attached. COMPARISON:  Chest CTA 03/29/2020. FINDINGS: Extracardiac findings will be described separately under dictation for contemporaneously obtained CTA chest, abdomen and pelvis. IMPRESSION: Please see separate dictation for contemporaneously obtained CTA chest, abdomen and pelvis dated 04/13/2020 for full description of relevant extracardiac findings. Electronically Signed: By: Trudie Reed M.D. On: 04/13/2020 11:27   DG Chest Portable 1 View  Result Date: 03/28/2020 CLINICAL DATA:  Chest pain and shortness of breath. EXAM: PORTABLE CHEST 1 VIEW COMPARISON:  March 26, 2020 FINDINGS: A dual lead AICD is noted. Mild atelectasis is seen within the left lung base. This is a new finding when compared to the prior exam. There is no evidence of a pleural effusion or pneumothorax. Stable elevation of the right hemidiaphragm is seen. The heart size and mediastinal contours are within normal limits. A radiopaque fusion plate and screws are seen overlying the lower cervical spine. The visualized skeletal structures are otherwise unremarkable. IMPRESSION: Mild left basilar atelectasis. Electronically Signed   By: Aram Candela M.D.   On: 03/28/2020 23:38   DG Chest Portable 1 View  Result Date: 03/26/2020 CLINICAL DATA:  Chest pain EXAM: PORTABLE CHEST 1 VIEW COMPARISON:  None. FINDINGS: Cardiac shadows within normal limits. Pacing device is noted. Postsurgical changes in the cervical spine are seen. Lungs are well aerated bilaterally. No focal infiltrate or sizable effusion is seen. IMPRESSION: No acute abnormality noted. Electronically Signed   By: Alcide Clever M.D.   On: 03/26/2020 22:39   CT ANGIO CHEST AORTA W/CM & OR WO/CM  Result Date: 04/14/2020 CLINICAL DATA:  75 year old male with history of severe aortic stenosis. Preprocedural study prior to potential transcatheter aortic valve replacement (TAVR). EXAM: CT ANGIOGRAPHY CHEST, ABDOMEN AND PELVIS TECHNIQUE: Multidetector CT imaging through the chest, abdomen and pelvis was performed using the standard protocol during bolus administration of intravenous contrast. Multiplanar reconstructed images and MIPs were obtained and reviewed to evaluate the vascular anatomy. CONTRAST:  OMNIPAQUE IOHEXOL 350 MG/ML SOLN COMPARISON:  Chest CTA 03/29/2020. FINDINGS: CTA CHEST FINDINGS Cardiovascular: Heart size is borderline enlarged. There  is no significant pericardial fluid, thickening or pericardial calcification. There is aortic atherosclerosis, as well as atherosclerosis of the great vessels of the mediastinum and the coronary arteries, including calcified atherosclerotic plaque in the left main, left anterior descending, left circumflex and right coronary arteries. Severe thickening and calcification of the aortic valve. Moderate calcifications of the mitral annulus. Left-sided pacemaker/AICD with lead tips terminating in the right atrium and right ventricular apex. Mediastinum/Lymph Nodes: Multiple prominent borderline enlarged mediastinal lymph nodes are noted, but nonspecific. No hilar lymphadenopathy. Esophagus is unremarkable in appearance. No axillary lymphadenopathy. Lungs/Pleura: No suspicious appearing pulmonary nodules or masses are noted. No acute consolidative airspace disease. No pleural effusions. Areas of mild bibasilar scarring. Musculoskeletal/Soft Tissues: Orthopedic fixation hardware in the cervical spine. There are no aggressive appearing lytic or blastic lesions noted in the visualized portions of the skeleton. CTA ABDOMEN AND PELVIS FINDINGS Hepatobiliary: Diffuse low attenuation throughout the hepatic parenchyma, indicative of hepatic steatosis. No suspicious cystic or solid hepatic lesions. No intra or extrahepatic biliary ductal dilatation. Gallbladder is normal in appearance. Pancreas: No pancreatic mass. No pancreatic ductal dilatation. No pancreatic or peripancreatic fluid collections or inflammatory changes. 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 Otherwise, unremarkable. Adrenals/Urinary Tract: 1.3 cm intermediate attenuation lesion in the interpolar region of the left kidney, incompletely characterized on today's  examination. Subcentimeter low-attenuation lesions in the lower pole of the right kidney, too small to characterize, but statistically likely to represent  cysts. Multifocal cortical thinning and mild generalized parenchymal atrophy in both kidneys. Bilateral adrenal glands are normal in appearance. No hydroureteronephrosis. Urinary bladder is unremarkable in appearance. Stomach/Bowel: The appearance of the stomach is normal. No pathologic dilatation of small bowel or colon. Normal appendix. Vascular/Lymphatic: Aortic atherosclerosis, without evidence of aneurysm or dissection in the abdominal or pelvic vasculature. No lymphadenopathy noted in the abdomen or pelvis. Reproductive: Prostate gland and seminal vesicles are unremarkable in appearance. Other: Esophagus is unremarkable in appearance. No axillary lymphadenopathy. Musculoskeletal: There are no aggressive appearing lytic or blastic lesions noted in the visualized portions of the skeleton. VASCULAR MEASUREMENTS PERTINENT TO TAVR: AORTA: Minimal Aortic Diameter-17 x 14 mm Severity of Aortic Calcification-moderate RIGHT PELVIS: Right Common Iliac Artery - Minimal Diameter-10.7 x 10.5 mm Tortuosity-mild Calcification-mild Right External Iliac Artery - Minimal Diameter-8.1 x 8.7 mm Tortuosity-moderate to severe Calcification-none Right Common Femoral Artery - Minimal Diameter-6.8 x 8.7 mm Tortuosity-mild Calcification-mild LEFT PELVIS: Left Common Iliac Artery - Minimal Diameter-11.5 x 8.9 mm Tortuosity-mild Calcification-mild Left External Iliac Artery - Minimal Diameter-8.5 x 8.0 mm Tortuosity-severe Calcification-none Left Common Femoral Artery - Minimal Diameter-8.3 x 5.4 mm Tortuosity-mild Calcification-mild Review of the MIP images confirms the above findings. IMPRESSION: 1. Vascular findings and measurements pertinent to potential TAVR procedure, as detailed above. 2. Severe thickening calcification of the aortic valve, compatible with reported clinical history of severe aortic stenosis. 3. Aortic atherosclerosis, in addition to left main and 3 vessel coronary artery disease. Assessment for potential risk  factor modification, dietary therapy or pharmacologic therapy may be warranted, if clinically indicated. 4. Hepatic steatosis. 5. Indeterminate lesion in the interpolar region of the left kidney. This may simply represent a small proteinaceous/hemorrhagic cyst. This could be definitively characterized with follow-up nonemergent abdominal MRI with and without IV gadolinium if of clinical concern. Alternatively, attention on follow-up hematuria protocol CT scan should be considered in 6 months. 6. Additional incidental findings, as above. Electronically Signed   By: Trudie Reed M.D.   On: 04/14/2020 07:08   ECHOCARDIOGRAM COMPLETE  Result Date: 03/29/2020    ECHOCARDIOGRAM REPORT   Patient Name:   Charles [name] Date of Exam: 03/29/2020 Medical Rec #:  [id number]            Height: Accession #:    [id number]           Weight: Date of Birth:  September 03, 1945            BSA: Patient Age:    74 years             BP:           177/88 mmHg Patient Gender: M                    HR:           71 bpm. Exam Location:  Inpatient Procedure: 2D Echo, Cardiac Doppler, Color Doppler and Intracardiac            Opacification Agent Indications:     Aortic stenosis I35.0  History:         Patient has no prior history of Echocardiogram examinations.                  Risk Factors:Hypertension, Dyslipidemia and Non-Smoker.  Sonographer:     Renella Cunas RDCS Referring Phys:  [phone]  Orpah Cobb Diagnosing Phys: Orpah Cobb MD IMPRESSIONS  1. Left ventricular ejection fraction, by estimation, is 70 to 75%. The left ventricle has hyperdynamic function. The left ventricle has no regional wall motion abnormalities. There is moderate concentric left ventricular hypertrophy. Left ventricular diastolic parameters are consistent with Grade I diastolic dysfunction (impaired relaxation).  2. Right ventricular systolic function is normal. The right ventricular size is normal.  3. Left atrial size was mildly dilated.  4. Right atrial size was  mildly dilated.  5. The mitral valve is degenerative. Mild mitral valve regurgitation. Moderate mitral annular calcification.  6. The aortic valve is tricuspid. Aortic valve regurgitation is trivial. Mild to moderate aortic valve stenosis.  7. There is mild (Grade II) atheroma plaque involving the aortic root and ascending aorta.  8. The inferior vena cava is normal in size with greater than 50% respiratory variability, suggesting right atrial pressure of 3 mmHg. FINDINGS  Left Ventricle: Left ventricular ejection fraction, by estimation, is 70 to 75%. The left ventricle has hyperdynamic function. The left ventricle has no regional wall motion abnormalities. Definity contrast agent was given IV to delineate the left ventricular endocardial borders. The left ventricular internal cavity size was normal in size. There is moderate concentric left ventricular hypertrophy. Left ventricular diastolic parameters are consistent with Grade I diastolic dysfunction (impaired relaxation). Right Ventricle: The right ventricular size is normal. No increase in right ventricular wall thickness. Right ventricular systolic function is normal. Left Atrium: Left atrial size was mildly dilated. Right Atrium: Right atrial size was mildly dilated. Pericardium: There is no evidence of pericardial effusion. Mitral Valve: The mitral valve is degenerative in appearance. There is mild thickening of the mitral valve leaflet(s). There is mild calcification of the mitral valve leaflet(s). Moderate mitral annular calcification. Mild mitral valve regurgitation. Tricuspid Valve: The tricuspid valve is normal in structure. Tricuspid valve regurgitation is mild. Aortic Valve: The aortic valve is tricuspid. Aortic valve regurgitation is trivial. Mild to moderate aortic stenosis is present. Aortic valve mean gradient measures 15.0 mmHg. Aortic valve peak gradient measures 28.5 mmHg. Pulmonic Valve: The pulmonic valve was normal in structure. Pulmonic  valve regurgitation is not visualized. Aorta: The aortic root is normal in size and structure. There is mild (Grade II) atheroma plaque involving the aortic root and ascending aorta. Venous: The inferior vena cava is normal in size with greater than 50% respiratory variability, suggesting right atrial pressure of 3 mmHg. IAS/Shunts: The interatrial septum was not well visualized.  LEFT VENTRICLE PLAX 2D LVIDd:         5.20 cm      Diastology LVIDs:         3.80 cm      LV e' medial:    3.68 cm/s LV PW:         1.70 cm      LV E/e' medial:  21.3 LV IVS:        1.70 cm      LV e' lateral:   4.13 cm/s LVOT diam:     2.60 cm      LV E/e' lateral: 19.0 LVOT Area:     5.31 cm  LV Volumes (MOD) LV vol d, MOD A2C: 135.0 ml LV vol d, MOD A4C: 202.0 ml LV vol s, MOD A2C: 32.1 ml LV vol s, MOD A4C: 70.4 ml LV SV MOD A2C:     102.9 ml LV SV MOD A4C:     202.0 ml LV SV MOD BP:  112.8 ml RIGHT VENTRICLE RV S prime:     14.90 cm/s TAPSE (M-mode): 2.0 cm LEFT ATRIUM             RIGHT ATRIUM LA diam:        5.00 cm RA Area:     15.10 cm LA Vol (A2C):   78.9 ml RA Volume:   38.90 ml LA Vol (A4C):   90.0 ml LA Biplane Vol: 83.3 ml  AORTIC VALVE AV Vmax:      267.00 cm/s AV Vmean:     176.667 cm/s AV VTI:       0.594 m AV Peak Grad: 28.5 mmHg AV Mean Grad: 15.0 mmHg  AORTA Ao Root diam: 3.80 cm Ao Asc diam:  3.50 cm MITRAL VALVE MV Area (PHT): 2.80 cm    SHUNTS MV Decel Time: 271 msec    Systemic Diam: 2.60 cm MV E velocity: 78.40 cm/s MV A velocity: 98.10 cm/s MV E/A ratio:  0.80 Orpah Cobb MD Electronically signed by Orpah Cobb MD Signature Date/Time: 03/29/2020/8:47:57 AM    Final    ECHO TEE  Result Date: 03/29/2020    TRANSESOPHOGEAL ECHO REPORT   Patient Name:   Charles [name] Date of Exam: 03/29/2020 Medical Rec #:  [id number]            Height: Accession #:    [id number]           Weight: Date of Birth:  Jul 24, 1945            BSA: Patient Age:    74 years             BP:           188/82 mmHg Patient Gender: M                     HR:           74 bpm. Exam Location:  Inpatient Procedure: Transesophageal Echo, 3D Echo, Color Doppler and Cardiac Doppler Indications:    Aortic Stenosis  History:        Patient has prior history of Echocardiogram examinations, most                 recent 03/29/2020. Risk Factors:Hypertension and ETOH.  Sonographer:    Eulah Pont RDCS Referring Phys: [id number] Orpah Cobb PROCEDURE: After discussion of the risks and benefits of a TEE, an informed consent was obtained from the patient. The transesophogeal probe was passed without difficulty through the esophogus of the patient. Sedation performed by different physician. The patient was monitored while under deep sedation. Anesthestetic sedation was provided intravenously by Anesthesiology:  of Propofol,  of Lidocaine. The patient's vital signs; including heart rate, blood pressure, and oxygen saturation; remained stable throughout the procedure. The patient developed no complications during the procedure. IMPRESSIONS  1. Left ventricular ejection fraction, by estimation, is 70 to 75%. The left ventricle has hyperdynamic function. The left ventricle has no regional wall motion abnormalities. There is severe concentric left ventricular hypertrophy.  2. Right ventricular systolic function is normal. The right ventricular size is normal.  3. Left atrial size was moderately dilated. No left atrial/left atrial appendage thrombus was detected.  4. Systolic anterior leaflet motion. The mitral valve is degenerative. Moderate mitral valve regurgitation. No evidence of mitral stenosis.  5. The aortic valve is calcified. There is severe calcifcation of the aortic valve. There is severe thickening of the aortic valve. Aortic valve regurgitation is  mild. Severe aortic valve stenosis.  6. The inferior vena cava is normal in size with greater than 50% respiratory variability, suggesting right atrial pressure of 3 mmHg. FINDINGS  Left Ventricle: Left  ventricular ejection fraction, by estimation, is 70 to 75%. The left ventricle has hyperdynamic function. The left ventricle has no regional wall motion abnormalities. The left ventricular internal cavity size was normal in size. There is severe concentric left ventricular hypertrophy. Right Ventricle: The right ventricular size is normal. No increase in right ventricular wall thickness. Right ventricular systolic function is normal. Left Atrium: Left atrial size was moderately dilated. No left atrial/left atrial appendage thrombus was detected. Right Atrium: Right atrial size was normal in size. Pericardium: There is no evidence of pericardial effusion. Mitral Valve: Systolic anterior leaflet motion. The mitral valve is degenerative in appearance. There is moderate thickening of the mitral valve leaflet(s). There is moderate calcification of the mitral valve leaflet(s). Mild mitral annular calcification. Moderate mitral valve regurgitation, with centrally-directed jet. No evidence of mitral valve stenosis. Tricuspid Valve: The tricuspid valve is normal in structure. Tricuspid valve regurgitation is mild. Aortic Valve: The aortic valve is calcified. There is severe calcifcation of the aortic valve. There is severe thickening of the aortic valve. There is moderate aortic valve annular calcification. Aortic valve regurgitation is mild. Severe aortic stenosis is present. Aortic valve mean gradient measures 30.0 mmHg. Aortic valve peak gradient measures 65.0 mmHg. Pulmonic Valve: The pulmonic valve was normal in structure. Pulmonic valve regurgitation is not visualized. Aorta: The aortic root is normal in size and structure. There is minimal (Grade I) atheroma plaque involving the aortic root, ascending aorta and descending aorta. Venous: The left lower pulmonary vein and right lower pulmonary vein are normal. The inferior vena cava is normal in size with greater than 50% respiratory variability, suggesting right atrial  pressure of 3 mmHg. IAS/Shunts: No atrial level shunt detected by color flow Doppler. Additional Comments: A device lead is visualized in the right atrium and superior vena cava.  AORTIC VALVE AV Vmax:           403.00 cm/s AV Vmean:          249.000 cm/s AV VTI:            0.763 m AV Peak Grad:      65.0 mmHg AV Mean Grad:      30.0 mmHg LVOT Vmax:         205.00 cm/s LVOT Vmean:        103.000 cm/s LVOT VTI:          0.226 m LVOT/AV VTI ratio: 0.30  SHUNTS Systemic VTI: 0.23 m Orpah Cobb MD Electronically signed by Orpah Cobb MD Signature Date/Time: 03/29/2020/7:19:03 PM    Final    CT ANGIO ABDOMEN PELVIS  W &/OR WO CONTRAST  Result Date: 04/14/2020 CLINICAL DATA:  75 year old male with history of severe aortic stenosis. Preprocedural study prior to potential transcatheter aortic valve replacement (TAVR). EXAM: CT ANGIOGRAPHY CHEST, ABDOMEN AND PELVIS TECHNIQUE: Multidetector CT imaging through the chest, abdomen and pelvis was performed using the standard protocol during bolus administration of intravenous contrast. Multiplanar reconstructed images and MIPs were obtained and reviewed to evaluate the vascular anatomy. CONTRAST:  OMNIPAQUE IOHEXOL 350 MG/ML SOLN COMPARISON:  Chest CTA 03/29/2020. FINDINGS: CTA CHEST FINDINGS Cardiovascular: Heart size is borderline enlarged. There is no significant pericardial fluid, thickening or pericardial calcification. There is aortic atherosclerosis, as well as atherosclerosis of the great vessels of  the mediastinum and the coronary arteries, including calcified atherosclerotic plaque in the left main, left anterior descending, left circumflex and right coronary arteries. Severe thickening and calcification of the aortic valve. Moderate calcifications of the mitral annulus. Left-sided pacemaker/AICD with lead tips terminating in the right atrium and right ventricular apex. Mediastinum/Lymph Nodes: Multiple prominent borderline enlarged mediastinal lymph nodes  are noted, but nonspecific. No hilar lymphadenopathy. Esophagus is unremarkable in appearance. No axillary lymphadenopathy. Lungs/Pleura: No suspicious appearing pulmonary nodules or masses are noted. No acute consolidative airspace disease. No pleural effusions. Areas of mild bibasilar scarring. Musculoskeletal/Soft Tissues: Orthopedic fixation hardware in the cervical spine. There are no aggressive appearing lytic or blastic lesions noted in the visualized portions of the skeleton. CTA ABDOMEN AND PELVIS FINDINGS Hepatobiliary: Diffuse low attenuation throughout the hepatic parenchyma, indicative of hepatic steatosis. No suspicious cystic or solid hepatic lesions. No intra or extrahepatic biliary ductal dilatation. Gallbladder is normal in appearance. Pancreas: No pancreatic mass. No pancreatic ductal dilatation. No pancreatic or peripancreatic fluid collections or inflammatory changes. 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 Otherwise, unremarkable. Adrenals/Urinary Tract: 1.3 cm intermediate attenuation lesion in the interpolar region of the left kidney, incompletely characterized on today's examination. Subcentimeter low-attenuation lesions in the lower pole of the right kidney, too small to characterize, but statistically likely to represent cysts. Multifocal cortical thinning and mild generalized parenchymal atrophy in both kidneys. Bilateral adrenal glands are normal in appearance. No hydroureteronephrosis. Urinary bladder is unremarkable in appearance. Stomach/Bowel: The appearance of the stomach is normal. No pathologic dilatation of small bowel or colon. Normal appendix. Vascular/Lymphatic: Aortic atherosclerosis, without evidence of aneurysm or dissection in the abdominal or pelvic vasculature. No lymphadenopathy noted in the abdomen or pelvis. Reproductive: Prostate gland and seminal vesicles are unremarkable in appearance. Other: Esophagus is unremarkable  in appearance. No axillary lymphadenopathy. Musculoskeletal: There are no aggressive appearing lytic or blastic lesions noted in the visualized portions of the skeleton. VASCULAR MEASUREMENTS PERTINENT TO TAVR: AORTA: Minimal Aortic Diameter-17 x 14 mm Severity of Aortic Calcification-moderate RIGHT PELVIS: Right Common Iliac Artery - Minimal Diameter-10.7 x 10.5 mm Tortuosity-mild Calcification-mild Right External Iliac Artery - Minimal Diameter-8.1 x 8.7 mm Tortuosity-moderate to severe Calcification-none Right Common Femoral Artery - Minimal Diameter-6.8 x 8.7 mm Tortuosity-mild Calcification-mild LEFT PELVIS: Left Common Iliac Artery - Minimal Diameter-11.5 x 8.9 mm Tortuosity-mild Calcification-mild Left External Iliac Artery - Minimal Diameter-8.5 x 8.0 mm Tortuosity-severe Calcification-none Left Common Femoral Artery - Minimal Diameter-8.3 x 5.4 mm Tortuosity-mild Calcification-mild Review of the MIP images confirms the above findings. IMPRESSION: 1. Vascular findings and measurements pertinent to potential TAVR procedure, as detailed above. 2. Severe thickening calcification of the aortic valve, compatible with reported clinical history of severe aortic stenosis. 3. Aortic atherosclerosis, in addition to left main and 3 vessel coronary artery disease. Assessment for potential risk factor modification, dietary therapy or pharmacologic therapy may be warranted, if clinically indicated. 4. Hepatic steatosis. 5. Indeterminate lesion in the interpolar region of the left kidney. This may simply represent a small proteinaceous/hemorrhagic cyst. This could be definitively characterized with follow-up nonemergent abdominal MRI with and without IV gadolinium if of clinical concern. Alternatively, attention on follow-up hematuria protocol CT scan should be considered in 6 months. 6. Additional incidental findings, as above. Electronically Signed   By: Trudie Reed M.D.   On: 04/14/2020 07:08

## 2020-04-21 NOTE — ED Notes (Signed)
[facility] to ED to transport pt back to facility. Discharge summary reviewed,pt verbalizes understanding. No s/s of acute distress noted at discharge. Pt voicing no complaints.

## 2020-04-24 ENCOUNTER — Telehealth: Payer: Self-pay | Admitting: Cardiovascular Disease

## 2020-04-24 ENCOUNTER — Telehealth: Payer: Self-pay | Admitting: Physician Assistant

## 2020-04-24 NOTE — Telephone Encounter (Signed)
Please see structural heart team phone note

## 2020-04-24 NOTE — Telephone Encounter (Signed)
Booked to see Dr. Laneta Simmers in the office next Monday 4/11 to discuss options further.    Cline Crock PA-C  [facility]

## 2020-04-24 NOTE — Telephone Encounter (Signed)
Patient's wife is requesting to speak with Cline Crock, PA regarding a valve replacement. She states she was told a decision will be made this morning.

## 2020-04-24 NOTE — Telephone Encounter (Addendum)
  [facility]   MULTIDISCIPLINARY HEART VALVE TEAM  Patient new pt to Dr. Algie Coffer from recent admission to [facility] for chest pain/dyspnea when he was diagnosed with severe AS. He has been living in [place], [place] for alcohol/drug rehabilitation at [facility], where he is currently residing. He and his wife live full time in [place], [place]. He was referred to our structural team for consideration of TAVR and evaluated by Dr. Excell Seltzer and Dr. Laneta Simmers.   Pt seen by Dr. Laneta Simmers on 3/29 for surgical consultation. Per Dr. Laneta Simmers, "There is severe concentric LVH with a 1.7 cm posterior wall and interventricular septum. TEE showed severe concentric LVH with systolic anterior motion of the mitral valve and moderate MR. The anterior leaflet mitral valve appears to contact the interventricular septum at the end of systole. This could be contributing to the mitral regurgitation and to his symptoms. The aortic valve was not crossed and neither the echo or cath studies evaluatedany gradient across the LVOT. Need to discuss best way to treat his severe LVH with systolic anterior motion of mitral valve. He does not have a distinct septal bulge impinging on the LVOT that would be amenable to straightforward septal myomectomy."  We had along discussion about him at our valve team meeting this morning and it was felt the best course of action would be alcohol septal ablation at an outside institution followed by TAVR vs open surgery with myomectomy, AVR +/- mitral valve repair. Dr. Laneta Simmers is currently on vacation so we will wait for him to return before making any further decisions.  I had a long talk with his wife today explaining our team discussion and she understands. She is understandably very concerned/anxious about the idea of him needing an invasive surgery with the need for pain medication or going to an outside institution for discussion of septal ablation as this would be difficult with his transition  into a sober living. Will wait for Dr. Laneta Simmers to return so we can make an official plan.   Cline Crock PA-C  [facility]

## 2020-04-27 ENCOUNTER — Ambulatory Visit: Payer: Medicare HMO | Attending: Cardiovascular Disease | Admitting: Physical Therapy

## 2020-04-27 ENCOUNTER — Other Ambulatory Visit (HOSPITAL_COMMUNITY): Payer: Medicare HMO

## 2020-04-27 ENCOUNTER — Ambulatory Visit (HOSPITAL_COMMUNITY)
Admission: RE | Admit: 2020-04-27 | Discharge: 2020-04-27 | Disposition: A | Discharge status: Home / Self Care | Payer: Medicare HMO | Source: Ambulatory Visit | Attending: Cardiovascular Disease | Admitting: Cardiovascular Disease

## 2020-04-27 ENCOUNTER — Encounter: Payer: Self-pay | Admitting: Physical Therapy

## 2020-04-27 ENCOUNTER — Other Ambulatory Visit: Payer: Self-pay

## 2020-04-27 DIAGNOSIS — R2689 Other abnormalities of gait and mobility: Secondary | ICD-10-CM | POA: Diagnosis not present

## 2020-04-27 DIAGNOSIS — Z01810 Encounter for preprocedural cardiovascular examination: Secondary | ICD-10-CM | POA: Diagnosis not present

## 2020-04-27 DIAGNOSIS — I10 Essential (primary) hypertension: Secondary | ICD-10-CM | POA: Insufficient documentation

## 2020-04-27 DIAGNOSIS — E785 Hyperlipidemia, unspecified: Secondary | ICD-10-CM | POA: Diagnosis not present

## 2020-04-27 DIAGNOSIS — I350 Nonrheumatic aortic (valve) stenosis: Secondary | ICD-10-CM | POA: Diagnosis not present

## 2020-04-27 NOTE — Therapy (Signed)
[facility] [address] [place], [place], [address] Phone: [phone]   Fax:  [phone]  Physical Therapy Evaluation  Patient Details  Name: Charles [name] MRN: [id number] Date of Birth: 1945-05-10 Referring Provider (PT): Dr Tonny Bollman   Encounter Date: 04/27/2020   PT End of Session - 04/27/20 0852    Visit Number 1    Number of Visits 1    Date for PT Re-Evaluation 04/27/20    PT Start Time 0845    PT Stop Time 0910    PT Time Calculation (min) 25 min    Activity Tolerance Patient tolerated treatment well    Behavior During Therapy [facility] for tasks assessed/performed           Past Medical History:  Diagnosis Date  . Alcohol abuse   . Depression   . HLD (hyperlipidemia)   . Hypertension   . S/P placement of cardiac pacemaker   . Severe aortic stenosis     Past Surgical History:  Procedure Laterality Date  . BUBBLE STUDY  03/29/2020   Procedure: BUBBLE STUDY;  Surgeon: Orpah Cobb, MD;  Location: [facility] ENDOSCOPY;  Service: Cardiovascular;;  . CERVICAL FUSION    . FOOT SURGERY    . INSERT / REPLACE / REMOVE PACEMAKER    . LEFT HEART CATH AND CORONARY ANGIOGRAPHY N/A 03/30/2020   Procedure: LEFT HEART CATH AND CORONARY ANGIOGRAPHY;  Surgeon: Orpah Cobb, MD;  Location: [facility] INVASIVE CV LAB;  Service: Cardiovascular;  Laterality: N/A;  . TEE WITHOUT CARDIOVERSION  03/29/2020  . TEE WITHOUT CARDIOVERSION N/A 03/29/2020   Procedure: TRANSESOPHAGEAL ECHOCARDIOGRAM (TEE);  Surgeon: Orpah Cobb, MD;  Location: [facility] ENDOSCOPY;  Service: Cardiovascular;  Laterality: N/A;    There were no vitals filed for this visit.    Subjective Assessment - 04/27/20 0848    Subjective Patient had an onset of chest pain about a month ago. He has a history of aortic stenosi. he has also been becoming more short of breath. He has had several ankle reconstructions and fusions    Pertinent History pacemaker, right ankle fusion     How long can you walk comfortably? has chest pain at times    Currently in Pain? Yes    Pain Score 2     Pain Location Ankle    Pain Orientation Right    Pain Descriptors / Indicators Aching    Pain Type Chronic pain    Pain Onset More than a month ago    Pain Frequency Constant    Aggravating Factors  standing and walking              [facility] PT Assessment - 04/27/20 0001      Assessment   Medical Diagnosis Severe Aortic Stenosis    Referring Provider (PT) Dr Tonny Bollman    Prior Therapy Tuesday      Precautions   Precautions None    Precaution Comments n      Restrictions   Weight Bearing Restrictions No      Balance Screen   Has the patient fallen in the past 6 months No    Has the patient had a decrease in activity level because of a fear of falling?  No    Is the patient reluctant to leave their home because of a fear of falling?  No      Prior Function   Level of Independence Independent    Vocation Retired  Cognition   Overall Cognitive Status Within Functional Limits for tasks assessed    Attention Focused    Focused Attention Appears intact    Memory Appears intact    Awareness Appears intact    Problem Solving Appears intact      Sensation   Light Touch Appears Intact    Additional Comments denies parathesias      Coordination   Gross Motor Movements are Fluid and Coordinated Yes    Fine Motor Movements are Fluid and Coordinated Yes      ROM / Strength   AROM / PROM / Strength AROM;Strength;PROM            [facility] Pre-Surgical Assessment - 04/27/20 0001    5 Meter Walk Test- trial 1 5 sec    5 Meter Walk Test- trial 2 5 sec.     5 Meter Walk Test- trial 3 5 sec.    5 meter walk test average 5 sec    4 Stage Balance Test tolerated for:  10 sec.    4 Stage Balance Test Position 3    comment unable to stand on right foot. Patient has had a major ankle reconstruction    Sit To Stand Test- trial 1 5 sec.    Comment 14 sec    ADL/IADL  Independent with: Bathing;Dressing;Meal prep;Finances    6 Minute Walk- Baseline yes    BP (mmHg) 126/73    HR (bpm) 64    02 Sat (%RA) 95 %    Modified Borg Scale for Dyspnea 0- Nothing at all    Perceived Rate of Exertion (Borg) 6-    6 Minute Walk Post Test yes    BP (mmHg) 154/79    HR (bpm) 94    02 Sat (%RA) 94 %    Modified Borg Scale for Dyspnea 0.5- Very, very slight shortness of breath    Perceived Rate of Exertion (Borg) 9- very light    Aerobic Endurance Distance Walked 1095    Endurance additional comments 36% disability                    Objective measurements completed on examination: See above findings.                            Plan - 04/27/20 1119    Clinical Impression Statement see below    Personal Factors and Comorbidities Comorbidity 1;Comorbidity 3+    Comorbidities right ankle fusion    Examination-Activity Limitations Locomotion Level    Stability/Clinical Decision Making Stable/Uncomplicated    Clinical Decision Making Low    Rehab Potential Excellent    PT Frequency One time visit    PT Treatment/Interventions Patient/family education    Consulted and Agree with Plan of Care Patient            Clinical Impression Statement: Pt is a 75 yo male presenting to OP PT for evaluation prior to possible TAVR surgery due to severe aortic stenosis. Pt reports onset of chest pain and dyspnea approximately 2 months ago. Symptoms are limiting ability to ambualte. Pt presents with normal ROM and strength, normal balance and is not at high fall risk 4 stage balance test, decreased walking speed and decreased aerobic endurance per 6 minute walk test. Pt ambulated  feet in 1095 At time of rest, patient's HR was 94 bpm and O2 was 94 on room air. Pt reported .5/10 shortness of breath  on modified scale for dyspnea  B/P increased significantly with 6 minute walk test. Based on the Short Physical Performance Battery, patient has a frailty  rating of 10/12 with </= 5/12 considered frail.     Patient will benefit from skilled therapeutic intervention in order to improve the following deficits and impairments:  Abnormal gait,Decreased activity tolerance,Decreased endurance  Visit Diagnosis: Other abnormalities of gait and mobility     Problem List Patient Active Problem List   Diagnosis Date Noted  . Severe aortic stenosis   . Hypertension   . HLD (hyperlipidemia)   . S/P placement of cardiac pacemaker   . Alcohol abuse   . Acute coronary syndrome [facility]) 03/29/2020    Dessie Coma 04/27/2020, 11:27 AM  [facility] [address] [place], [place], [address] Phone: [phone]   Fax:  [phone]  Name: CONSTANTINO STARACE MRN: [id number] Date of Birth: 1945-06-09

## 2020-04-27 NOTE — Progress Notes (Signed)
Carotid Completed    Please see CV Proc for preliminary results.   Charles [name], RVT  

## 2020-04-30 ENCOUNTER — Other Ambulatory Visit: Payer: Self-pay

## 2020-04-30 ENCOUNTER — Encounter: Payer: Self-pay | Admitting: Surgery

## 2020-04-30 ENCOUNTER — Ambulatory Visit: Payer: Medicare HMO | Admitting: Surgery

## 2020-04-30 VITALS — BP 122/63 | HR 78 | Temp 98.4°F | Resp 20 | Ht 72.0 in | Wt 214.0 lb

## 2020-04-30 DIAGNOSIS — I350 Nonrheumatic aortic (valve) stenosis: Secondary | ICD-10-CM

## 2020-04-30 NOTE — Progress Notes (Signed)
HPI:  The patient returns today to review treatment options for his moderate to severe symptomatic aortic stenosis with severe concentric LVH and systolic anterior motion of mitral valve with LV outflow tract obstruction and moderate mitral regurgitation.  He was being considered for possible TAVR but after review of his TEE and discussion at our multidisciplinary heart valve meeting it was felt that TAVR alone would not correct this problem.  He is currently in alcohol rehabilitation at [facility].  His symptoms of shortness of breath and chest discomfort with walking long distances has been stable and he has been walking slower to avoid symptoms.  He denies any orthopnea or PND.  Has had no peripheral edema.  He denies any dizziness or syncope.  Current Outpatient Medications  Medication Sig Dispense Refill  . aspirin 81 MG EC tablet Take 81 mg by mouth daily.    Marland Kitchen atorvastatin (LIPITOR) 40 MG tablet Take 40 mg by mouth daily.    . Cholecalciferol 25 MCG (1000 UT) capsule Take 2,000 Units by mouth daily.    . DULoxetine HCl 60 MG CSDR Take 60 mg by mouth daily.    . furosemide (LASIX) 20 MG tablet Take 20 mg by mouth daily.    Marland Kitchen lamoTRIgine (LAMICTAL) 25 MG tablet Take 50 mg by mouth daily.    Marland Kitchen lisinopril (ZESTRIL) 20 MG tablet Take 20 mg by mouth daily.    . metoprolol tartrate (LOPRESSOR) 50 MG tablet Take 1 tablet (50 mg total) by mouth 2 (two) times daily. (Patient taking differently: Take 150 mg by mouth 2 (two) times daily.) 60 tablet 3  . Multiple Vitamins-Minerals (MULTI COMPLETE) CAPS Take 1 capsule by mouth daily.    . nitroGLYCERIN (NITROSTAT) 0.4 MG SL tablet Place 0.4 mg under the tongue every 5 (five) minutes as needed for chest pain (max 3 doses).    Marland Kitchen omeprazole (PRILOSEC) 20 MG capsule Take 20 mg by mouth at bedtime.    . thiamine 100 MG tablet Take 100 mg by mouth daily.    . traZODone (DESYREL) 50 MG tablet Take 50 mg by mouth at bedtime.     No current  facility-administered medications for this visit.     Physical Exam: BP 122/63 (BP Location: Left Arm, Patient Position: Sitting, Cuff Size: Large)   Pulse 78   Temp 98.4 F (36.9 C) (Skin)   Resp 20   Ht 6' (1.829 m)   Wt 214 lb (97.1 kg)   SpO2 94% Comment: RA  BMI 29.02 kg/m  He looks well. Cardiac exam shows a regular rate and rhythm with a 3/6 systolic murmur along the right sternal border.  There is no diastolic murmur. Lungs are clear. Extremity exam shows no peripheral edema.    Impression:  This 75 year old gentleman has stage D, moderate to severe, symptomatic aortic stenosis with New York Heart Association class II-III symptoms of exertional fatigue and shortness of breath consistent with chronic diastolic congestive heart failure.  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  He has severe concentric LVH.  He was discussed at our multidisciplinary heart valve meeting and it was felt that the options were to consider referral for alcohol septal ablation followed by TAVR if it is successful versus open surgical aortic valve replacement with septal myomectomy and possible mitral valve repair or replacement.  He is not interested in referral to another center  for consideration of alcohol septal ablation since he may not be a candidate for and it may not be effective if performed.  He would like to wait for a few months to finish the rehab program and then proceed with open surgical treatment.  His symptoms seem stable at this time and given his degree of aortic stenosis and left ventricular systolic function I think the risk of waiting 3 months is relatively low.  Plan:  He will call us back after he completes his rehab program to schedule open surgical aortic valve replacement and septal myomectomy, possible mitral valve repair or replacement.  I will see him back  preoperatively for reevaluation and to answer any further questions.  I spent 15 minutes performing this established patient evaluation and > 50% of this time was spent face to face counseling and coordinating the care of this patient's severe aortic stenosis and systolic anterior motion of mitral valve.    [name], MD [facility] [phone]

## 2020-05-23 ENCOUNTER — Ambulatory Visit: Payer: Medicare HMO | Admitting: Surgery

## 2020-06-10 ENCOUNTER — Other Ambulatory Visit: Payer: Self-pay | Admitting: *Deleted

## 2020-06-10 DIAGNOSIS — I350 Nonrheumatic aortic (valve) stenosis: Secondary | ICD-10-CM

## 2020-06-11 ENCOUNTER — Encounter: Payer: Self-pay | Admitting: *Deleted

## 2020-06-20 ENCOUNTER — Encounter: Payer: Self-pay | Admitting: Surgery

## 2020-06-20 ENCOUNTER — Ambulatory Visit: Payer: Medicare HMO | Admitting: Surgery

## 2020-06-20 ENCOUNTER — Other Ambulatory Visit: Payer: Self-pay

## 2020-06-20 VITALS — BP 143/79 | HR 78 | Resp 20 | Ht 72.0 in | Wt 204.0 lb

## 2020-06-20 DIAGNOSIS — I350 Nonrheumatic aortic (valve) stenosis: Secondary | ICD-10-CM | POA: Diagnosis not present

## 2020-06-20 NOTE — Progress Notes (Signed)
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 He was recently hospitalized at [facility] for shortness of breath and chest discomfort with exertion and was diagnosed with severe aortic stenosis. 2D echo showed a trileaflet aortic valve with a mean gradient of 15 mmHg and peak gradient of 28.5 mmHg.  Left ventricular ejection fraction was 70 to 75% with moderate concentric LVH.  TEE confirmed a severely calcified and thickened aortic valve.  The mean gradient was measured at 30 mmHg with a peak gradient of 65 mmHg.  There was Left ventricular ejection fraction is 70 to 75% with severe concentric LVH.  There was severe concentric LVH with systolic anterior motion of the mitral valve and moderate mitral regurgitation.  The anterior leaflet mitral valve appears to contact the interventricular septum at the end of systole.  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 After discussion at our multidisciplinary heart valve meeting it was felt that TAVR alone would not correct this problem. He says that he feels fairly well and only gets short of breath with long or fast walks. He denies any orthopnea or PND.  Has had no peripheral edema.  He denies any dizziness or syncope.  Current Outpatient Medications  Medication Sig Dispense Refill  . aspirin 81 MG EC tablet Take 81 mg by mouth daily.    Marland Kitchen atorvastatin (LIPITOR) 40 MG tablet Take 40 mg by mouth daily.    . Cholecalciferol 25 MCG (1000 UT) capsule Take 2,000 Units by mouth daily.    . DULoxetine HCl 60 MG CSDR Take 120 mg by mouth daily.    . furosemide (LASIX) 20 MG tablet Take 20 mg by mouth daily.    Marland Kitchen lamoTRIgine (LAMICTAL) 25 MG tablet Take 150 mg by mouth daily.    Marland Kitchen lisinopril (ZESTRIL) 20  MG tablet Take 20 mg by mouth daily.    . metoprolol tartrate (LOPRESSOR) 50 MG tablet Take 1 tablet (50 mg total) by mouth 2 (two) times daily. (Patient taking differently: Take 150 mg by mouth 2 (two) times daily.) 60 tablet 3  . Multiple Vitamins-Minerals (MULTI COMPLETE) CAPS Take 1 capsule by mouth daily.    Marland Kitchen omeprazole (PRILOSEC) 20 MG capsule Take 20 mg by mouth at bedtime.    . thiamine 100 MG tablet Take 100 mg by mouth daily.    . traZODone (DESYREL) 50 MG tablet Take 150 mg by mouth at bedtime.    . nitroGLYCERIN (NITROSTAT) 0.4 MG SL tablet Place 0.4 mg under the tongue every 5 (five) minutes as needed for chest pain (max 3 doses). (Patient not taking: Reported on 06/20/2020)     No current facility-administered medications for this visit.     Physical Exam: BP (!) 143/79 (BP Location: Left Arm, Patient Position: Sitting)   Pulse 78   Resp 20   Ht 6' (1.829 m)   Wt 204 lb (92.5 kg)   SpO2 91% Comment: RA  BMI 27.67 kg/m  He looks well. Cardiac exam shows a regular rate and rhythm with a 3/6 systolic murmur along the right sternal border.  There is no diastolic murmur. Lungs are clear. Extremity exam shows no peripheral edema.  Diagnostic Tests:  None today  Impression:  This 75 year old gentleman has stage D, moderate to severe, symptomatic aortic  stenosis with New York Heart Association class II-III symptoms of exertional fatigue and shortness of breath consistent with chronic diastolic congestive heart failure.  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 It is felt that the best treatment is to perform AVR with septal myectomy and hopefully this will resolve his SAM and MR. It is still possible that he may require MV repair or replacement but I think that is unlikely. I discussed the operative procedure with the patient and his son including alternatives,  benefits and risks; including but not limited to bleeding, blood transfusion, infection, stroke, myocardial infarction, graft failure, heart block requiring a permanent pacemaker, organ dysfunction, and death.  Dayton Bailiff III understands and agrees to proceed.     Plan:  AVR and septal myectomy, possible MV repair or replacement.   I spent 20 minutes performing this established patient evaluation and > 50% of this time was spent face to face counseling and coordinating the care of this patient's severe aortic stenosis and hypertrophic obstructive cardiomyopathy with SAM and moderate MR.    [name], MD [facility] [phone]

## 2020-07-04 NOTE — Progress Notes (Signed)
Surgical Instructions    Your procedure is scheduled on Monday June 20th.  Report to [facility] Main Entrance "A" at 5:30 A.M., then check in with the Admitting office.  Call this number if you have problems the morning of surgery:  [phone]   If you have any questions prior to your surgery date call [phone]: Open Monday-Friday 8am-4pm    Remember:  Do not eat or drink anything after midnight the night before your surgery    Take these medicines the morning of surgery with A SIP OF WATER  atorvastatin (LIPITOR) 40 MG tablet DULoxetine (CYMBALTA) 60 MG capsule lamoTRIgine (LAMICTAL) 25 MG tablet metoprolol tartrate (LOPRESSOR) 50 MG tablet  IF NEEDED nitroGLYCERIN (NITROSTAT) 0.4 MG SL tablet  As of today, STOP taking any Aspirin (unless otherwise instructed by your surgeon) Aleve, Naproxen, Ibuprofen, Motrin, Advil, Goody's, BC's, all herbal medications, fish oil, and all vitamins.          Do not wear jewelry  Do not wear lotions, powders, colognes, or deodorant. Do not shave 48 hours prior to surgery.  Men may shave face and neck. Do not bring valuables to the hospital. DO Not wear nail polish, gel polish, artificial nails, or any other type of covering on  natural nails including finger and toenails. If patients have artificial nails, gel coating, etc. that need to be removed by a nail salon please have this removed prior to surgery or surgery may need to be canceled/delayed if the surgeon/ anesthesia feels like the patient is unable to be adequately monitored.             [facility] is not responsible for any belongings or valuables.  Do NOT Smoke (Tobacco/Vaping) or drink Alcohol 24 hours prior to your procedure If you use a CPAP at night, you may bring all equipment for your overnight stay.   Contacts, glasses, dentures or bridgework may not be worn into surgery, please bring cases for these belongings   For patients admitted to the hospital, discharge time  will be determined by your treatment team.   Patients discharged the day of surgery will not be allowed to drive home, and someone needs to stay with them for 24 hours.  ONLY 1 SUPPORT PERSON MAY BE PRESENT WHILE YOU ARE IN SURGERY. IF YOU ARE TO BE ADMITTED ONCE YOU ARE IN YOUR ROOM YOU WILL BE ALLOWED TWO (2) VISITORS.  Minor children may have two parents present. Special consideration for safety and communication needs will be reviewed on a case by case basis.  Special instructions:    Oral Hygiene is also important to reduce your risk of infection.  Remember - BRUSH YOUR TEETH THE MORNING OF SURGERY WITH YOUR REGULAR TOOTHPASTE   [facility]- Preparing For Surgery  Before surgery, you can play an important role. Because skin is not sterile, your skin needs to be as free of germs as possible. You can reduce the number of germs on your skin by washing with CHG (chlorahexidine gluconate) Soap before surgery.  CHG is an antiseptic cleaner which kills germs and bonds with the skin to continue killing germs even after washing.     Please do not use if you have an allergy to CHG or antibacterial soaps. If your skin becomes reddened/irritated stop using the CHG.  Do not shave (including legs and underarms) for at least 48 hours prior to first CHG shower. It is OK to shave your face.  Please follow these instructions carefully.  Shower the NIGHT BEFORE SURGERY and the MORNING OF SURGERY with CHG Soap.   If you chose to wash your hair, wash your hair first as usual with your normal shampoo. After you shampoo, rinse your hair and body thoroughly to remove the shampoo.  Then [employer] and genitals (private parts) with your normal soap and rinse thoroughly to remove soap.  After that Use CHG Soap as you would any other liquid soap. You can apply CHG directly to the skin and wash gently with a scrungie or a clean washcloth.   Apply the CHG Soap to your body ONLY FROM THE NECK DOWN.  Do not use on  open wounds or open sores. Avoid contact with your eyes, ears, mouth and genitals (private parts). Wash Face and genitals (private parts)  with your normal soap.   Wash thoroughly, paying special attention to the area where your surgery will be performed.  Thoroughly rinse your body with warm water from the neck down.  DO NOT shower/wash with your normal soap after using and rinsing off the CHG Soap.  Pat yourself dry with a CLEAN TOWEL.  Wear CLEAN PAJAMAS to bed the night before surgery  Place CLEAN SHEETS on your bed the night before your surgery  DO NOT SLEEP WITH PETS.   Day of Surgery:  Take a shower with CHG soap. Wear Clean/Comfortable clothing the morning of surgery Do not apply any deodorants/lotions.   Remember to brush your teeth WITH YOUR REGULAR TOOTHPASTE.   Please read over the following fact sheets that you were given.

## 2020-07-05 ENCOUNTER — Other Ambulatory Visit: Payer: Self-pay

## 2020-07-05 ENCOUNTER — Encounter (HOSPITAL_COMMUNITY): Payer: Self-pay

## 2020-07-05 ENCOUNTER — Encounter (HOSPITAL_COMMUNITY)
Admission: RE | Admit: 2020-07-05 | Discharge: 2020-07-05 | Disposition: A | Discharge status: Home / Self Care | Payer: Medicare HMO | Source: Ambulatory Visit | Attending: Surgery | Admitting: Surgery

## 2020-07-05 ENCOUNTER — Ambulatory Visit (HOSPITAL_COMMUNITY)
Admission: RE | Admit: 2020-07-05 | Discharge: 2020-07-05 | Disposition: A | Discharge status: Home / Self Care | Payer: Medicare HMO | Source: Ambulatory Visit | Attending: Surgery | Admitting: Surgery

## 2020-07-05 DIAGNOSIS — I350 Nonrheumatic aortic (valve) stenosis: Secondary | ICD-10-CM | POA: Diagnosis not present

## 2020-07-05 DIAGNOSIS — Z01818 Encounter for other preprocedural examination: Secondary | ICD-10-CM | POA: Insufficient documentation

## 2020-07-05 DIAGNOSIS — Z20822 Contact with and (suspected) exposure to covid-19: Secondary | ICD-10-CM | POA: Diagnosis not present

## 2020-07-05 DIAGNOSIS — E785 Hyperlipidemia, unspecified: Secondary | ICD-10-CM | POA: Insufficient documentation

## 2020-07-05 DIAGNOSIS — I10 Essential (primary) hypertension: Secondary | ICD-10-CM | POA: Insufficient documentation

## 2020-07-05 DIAGNOSIS — I700 Atherosclerosis of aorta: Secondary | ICD-10-CM | POA: Insufficient documentation

## 2020-07-05 LAB — CBC
HCT: 44.3 % (ref 39.0–52.0)
Hemoglobin: 14.8 g/dL (ref 13.0–17.0)
MCH: 30.1 pg (ref 26.0–34.0)
MCHC: 33.4 g/dL (ref 30.0–36.0)
MCV: 90.2 fL (ref 80.0–100.0)
Platelets: 227 10*3/uL (ref 150–400)
RBC: 4.91 MIL/uL (ref 4.22–5.81)
RDW: 13 % (ref 11.5–15.5)
WBC: 6.4 10*3/uL (ref 4.0–10.5)
nRBC: 0 % (ref 0.0–0.2)

## 2020-07-05 LAB — BLOOD GAS, ARTERIAL
Acid-Base Excess: 2.1 mmol/L — ABNORMAL HIGH (ref 0.0–2.0)
Bicarbonate: 26.3 mmol/L (ref 20.0–28.0)
Drawn by: 602861
FIO2: 21
O2 Saturation: 95.2 %
Patient temperature: 37
pCO2 arterial: 42.2 mmHg (ref 32.0–48.0)
pH, Arterial: 7.411 (ref 7.350–7.450)
pO2, Arterial: 77.1 mmHg — ABNORMAL LOW (ref 83.0–108.0)

## 2020-07-05 LAB — SARS CORONAVIRUS 2 (TAT 6-24 HRS): SARS Coronavirus 2: NEGATIVE

## 2020-07-05 LAB — COMPREHENSIVE METABOLIC PANEL
ALT: 21 U/L (ref 0–44)
AST: 27 U/L (ref 15–41)
Albumin: 4 g/dL (ref 3.5–5.0)
Alkaline Phosphatase: 78 U/L (ref 38–126)
Anion gap: 14 (ref 5–15)
BUN: 13 mg/dL (ref 8–23)
CO2: 23 mmol/L (ref 22–32)
Calcium: 9.7 mg/dL (ref 8.9–10.3)
Chloride: 99 mmol/L (ref 98–111)
Creatinine, Ser: 1.08 mg/dL (ref 0.61–1.24)
GFR, Estimated: >60 mL/min (ref >60)
Glucose, Bld: 105 mg/dL — ABNORMAL HIGH (ref 70–99)
Potassium: 4.3 mmol/L (ref 3.5–5.1)
Sodium: 136 mmol/L (ref 135–145)
Total Bilirubin: 0.3 mg/dL (ref 0.3–1.2)
Total Protein: 6.7 g/dL (ref 6.5–8.1)

## 2020-07-05 LAB — PROTIME-INR
INR: 1 (ref 0.8–1.2)
Prothrombin Time: 13.2 seconds (ref 11.4–15.2)

## 2020-07-05 LAB — SURGICAL PCR SCREEN
MRSA, PCR: NEGATIVE
Staphylococcus aureus: NEGATIVE

## 2020-07-05 LAB — URINALYSIS, ROUTINE W REFLEX MICROSCOPIC
Bilirubin Urine: NEGATIVE
Glucose, UA: NEGATIVE mg/dL
Hgb urine dipstick: NEGATIVE
Ketones, ur: NEGATIVE mg/dL
Leukocytes,Ua: NEGATIVE
Nitrite: NEGATIVE
Protein, ur: NEGATIVE mg/dL
Specific Gravity, Urine: 1.009 (ref 1.005–1.030)
pH: 6 (ref 5.0–8.0)

## 2020-07-05 LAB — APTT: aPTT: 32 seconds (ref 24–36)

## 2020-07-05 NOTE — Progress Notes (Signed)
Requested device orders from Dr. Bernette Mayers EP cardiologist in [facility] on 07/05/20. Also requested last office note from Dr. Rolene Arbour cardiologist in [place], [place].  Emailed Sherril Croon with [employer] to make him aware of upcoming surgery and cc'd Lindsi, RN.

## 2020-07-05 NOTE — Progress Notes (Signed)
PCP - Dr. Graciella Freer Cardiologist - DR. Hakas in [place], [place]  PPM/ICD - [employer] Device Orders - Requested 07/05/20 Rep Notified - Will notify Sherril Croon  Chest x-ray - 07/05/20 EKG - 07/05/20 Stress Test - 2020 ECHO - 06/10/20 Cardiac Cath - 03/30/20  Sleep Study - Yes has OSA CPAP - Does not use  DM - Denies  Blood Thinner Instructions:Denies Aspirin Instructions:Will call Dr. Sharee Pimple office today for instruction on when to stop.  COVID TEST- 07/05/20   Anesthesia review: Cardiac history  Patient denies shortness of breath, fever, cough and chest pain at PAT appointment   All instructions explained to the patient, with a verbal understanding of the material. Patient agrees to go over the instructions while at home for a better understanding. Patient also instructed to wear a mask in public after being tested for COVID-19. The opportunity to ask questions was provided.

## 2020-07-05 NOTE — Progress Notes (Signed)
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  See CV proc for preliminary results.   Schylar Wuebker, RVT 07/05/2020, 1:29 PM

## 2020-07-06 MED ORDER — EPINEPHRINE HCL 5 MG/250ML IV SOLN IN NS
0.0000 ug/min | INTRAVENOUS | Status: DC
  Filled 2020-07-06: qty 250

## 2020-07-06 MED ORDER — TRANEXAMIC ACID (OHS) PUMP PRIME SOLUTION
2.0000 mg/kg | INTRAVENOUS | Status: DC
  Filled 2020-07-06: qty 1.89

## 2020-07-06 MED ORDER — MANNITOL 20 % IV SOLN
Freq: Once | INTRAVENOUS | Status: DC
  Filled 2020-07-06: qty 13

## 2020-07-06 MED ORDER — PHENYLEPHRINE HCL-NACL 20-0.9 MG/250ML-% IV SOLN
30.0000 ug/min | INTRAVENOUS | Status: AC
  Administered 2020-07-09: 25 ug/min via INTRAVENOUS
  Administered 2020-07-09: 20 ug/min via INTRAVENOUS
  Filled 2020-07-06: qty 250

## 2020-07-06 MED ORDER — DEXMEDETOMIDINE HCL IN NACL 400 MCG/100ML IV SOLN
0.1000 ug/kg/h | INTRAVENOUS | Status: AC
Start: 2020-07-09 — End: 2020-07-09
  Administered 2020-07-09: .7 ug/kg/h via INTRAVENOUS
  Filled 2020-07-06: qty 100

## 2020-07-06 MED ORDER — CEFAZOLIN SODIUM-DEXTROSE 2-4 GM/100ML-% IV SOLN
2.0000 g | INTRAVENOUS | Status: DC
  Filled 2020-07-06: qty 100

## 2020-07-06 MED ORDER — SODIUM CHLORIDE 0.9 % IV SOLN
INTRAVENOUS | Status: DC
  Filled 2020-07-06: qty 30

## 2020-07-06 MED ORDER — CEFAZOLIN SODIUM-DEXTROSE 2-4 GM/100ML-% IV SOLN
2.0000 g | INTRAVENOUS | Status: AC
  Administered 2020-07-09 (×2): 2 g via INTRAVENOUS
  Filled 2020-07-06: qty 100

## 2020-07-06 MED ORDER — VANCOMYCIN HCL 1500 MG/300ML IV SOLN
1500.0000 mg | INTRAVENOUS | Status: AC
  Administered 2020-07-09: 1500 mg via INTRAVENOUS
  Filled 2020-07-06: qty 300

## 2020-07-06 MED ORDER — INSULIN REGULAR(HUMAN) IN NACL 100-0.9 UT/100ML-% IV SOLN
INTRAVENOUS | Status: AC
  Administered 2020-07-09: .9 [IU]/h via INTRAVENOUS
  Filled 2020-07-06: qty 100

## 2020-07-06 MED ORDER — PLASMA-LYTE A IV SOLN
INTRAVENOUS | Status: DC
  Filled 2020-07-06: qty 5

## 2020-07-06 MED ORDER — POTASSIUM CHLORIDE 2 MEQ/ML IV SOLN
80.0000 meq | INTRAVENOUS | Status: DC
  Filled 2020-07-06: qty 40

## 2020-07-06 MED ORDER — NITROGLYCERIN IN D5W 200-5 MCG/ML-% IV SOLN
2.0000 ug/min | INTRAVENOUS | Status: DC
  Filled 2020-07-06: qty 250

## 2020-07-06 MED ORDER — NOREPINEPHRINE 4 MG/250ML-% IV SOLN
0.0000 ug/min | INTRAVENOUS | Status: DC
  Filled 2020-07-06: qty 250

## 2020-07-06 MED ORDER — TRANEXAMIC ACID 1000 MG/10ML IV SOLN
1.5000 mg/kg/h | INTRAVENOUS | Status: AC
  Administered 2020-07-09: 1.5 mg/kg/h via INTRAVENOUS
  Filled 2020-07-06: qty 25

## 2020-07-06 MED ORDER — TRANEXAMIC ACID (OHS) BOLUS VIA INFUSION
15.0000 mg/kg | INTRAVENOUS | Status: AC
  Administered 2020-07-09: 1264.5 mg via INTRAVENOUS
  Filled 2020-07-06: qty 1265

## 2020-07-06 MED ORDER — MILRINONE LACTATE IN DEXTROSE 20-5 MG/100ML-% IV SOLN
0.3000 ug/kg/min | INTRAVENOUS | Status: DC
  Filled 2020-07-06: qty 100

## 2020-07-06 NOTE — H&P (Signed)
[address]       [place] [address]             [phone]      Cardiothoracic Surgery Admission History and Physical   Referring Provider is Orpah Cobb, MD Primary Cardiologist is No primary care provider on file. 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  He was recently hospitalized at [facility] for shortness of breath and chest discomfort with exertion and was diagnosed with severe aortic stenosis.  He lives in [place] with his wife and has been followed by cardiology in [facility].  He reports having echocardiograms over the past several years to follow his aortic stenosis.  He has not been very active over the past few years due to a right ankle injury that required multiple surgeries.  He said that he has to walk a fairly long distance to the dining room every day and has experienced substernal chest discomfort and shortness of breath with doing that.  This is relieved with rest.  He denies any orthopnea or PND.  On 03/28/2020 he developed significant chest discomfort radiating to his shoulder and jawline was taken by EMS to the emergency room where he had minimal troponin elevation and a flat trend.  He was admitted under the care of Dr. Algie Coffer and underwent work-up including echo, cardiac cath, and TEE.  2D echo showed a trileaflet aortic valve with a mean gradient of 15 mmHg and peak gradient of 28.5 mmHg.  Left ventricular ejection fraction was 70 to 75% with moderate concentric LVH.  TEE confirmed a severely calcified and thickened aortic valve.  The mean gradient was measured at 30  mmHg with a peak gradient of 65 mmHg.  Left ventricular ejection fraction is 70 to 75% with severe concentric LVH.  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 After discussion at our multidisciplinary heart valve meeting it was felt that TAVR alone would not correct this problem. He says that he feels fairly well and only gets short of breath with long or fast walks. He denies any orthopnea or PND.  Has had no peripheral edema.  He denies any dizziness or syncope.       Past Medical History:  Diagnosis Date   Alcohol abuse     Depression     HLD (hyperlipidemia)     Hypertension     S/P placement of cardiac pacemaker     Severe aortic stenosis             Past Surgical History:  Procedure Laterality Date   BUBBLE STUDY   03/29/2020    Procedure: BUBBLE STUDY;  Surgeon: Orpah Cobb, MD;  Location: [facility] ENDOSCOPY;  Service: Cardiovascular;;   CERVICAL FUSION       FOOT SURGERY       INSERT / REPLACE / REMOVE PACEMAKER       LEFT HEART CATH AND CORONARY ANGIOGRAPHY N/A 03/30/2020    Procedure: LEFT HEART CATH AND CORONARY ANGIOGRAPHY;  Surgeon: Orpah Cobb, MD;  Location: [facility] INVASIVE CV LAB;  Service:  Cardiovascular;  Laterality: N/A;   TEE WITHOUT CARDIOVERSION   03/29/2020   TEE WITHOUT CARDIOVERSION N/A 03/29/2020    Procedure: TRANSESOPHAGEAL ECHOCARDIOGRAM (TEE);  Surgeon: Orpah Cobb, MD;  Location: [facility] ENDOSCOPY;  Service: Cardiovascular;  Laterality: N/A;      History reviewed. No pertinent family history.   Social History         Socioeconomic History   Marital status: Married      Spouse name: Not on file   Number of children: Not on file   Years of education: Not on file   Highest education level: Not on file  Occupational History   Not on file  Tobacco Use   Smoking status: Former Smoker   Smokeless tobacco: Never Used  Building services engineer Use: Never used  Substance and Sexual Activity   Alcohol use: Yes      Comment: 3/4 a 5th daily    Drug use: Never   Sexual activity: Not on file  Other Topics Concern   Not on file  Social History Narrative   Not on file    Social Determinants of Health    Financial Resource Strain: Not on file  Food Insecurity: Not on file  Transportation Needs: Not on file  Physical Activity: Not on file  Stress: Not on file  Social Connections: Not on file  Intimate Partner Violence: Not on file            Current Outpatient Medications  Medication Sig Dispense Refill   aspirin 81 MG EC tablet Take 81 mg by mouth daily.       atorvastatin (LIPITOR) 40 MG tablet Take 40 mg by mouth daily.       Cholecalciferol 25 MCG (1000 UT) capsule Take 2,000 Units by mouth daily.       DULoxetine HCl 60 MG CSDR Take 60 mg by mouth daily.       furosemide (LASIX) 20 MG tablet Take 20 mg by mouth daily.       lisinopril (ZESTRIL) 20 MG tablet Take 20 mg by mouth daily.       metoprolol tartrate (LOPRESSOR) 50 MG tablet Take 1 tablet (50 mg total) by mouth 2 (two) times daily. 60 tablet 3   Multiple Vitamins-Minerals (MULTI COMPLETE) CAPS Take 1 capsule by mouth daily.       nitroGLYCERIN (NITROSTAT) 0.4 MG SL tablet Place 0.4 mg under the tongue every 5 (five) minutes as needed for chest pain (max 3 doses).       omeprazole (PRILOSEC) 20 MG capsule Take 20 mg by mouth at bedtime.       thiamine 100 MG tablet Take 100 mg by mouth daily.       traZODone (DESYREL) 50 MG tablet Take 50 mg by mouth at bedtime as needed for sleep.       vitamin C (ASCORBIC ACID) 500 MG tablet Take 500 mg by mouth daily.        No current facility-administered medications for this visit.           Allergies  Allergen Reactions   Shellfish-Derived Products Anaphylaxis   Gabapentin Other (See Comments)      Hallucinations     Tramadol Other (See Comments)      Hallucinations            Review of Systems:               General:  normal appetite, + decreased energy, no weight gain, no weight  loss, no fever             Cardiac:                       + chest pain with exertion, no chest pain at rest, +SOB with mild exertion, no resting SOB, no PND, no orthopnea, no palpitations, no arrhythmia, no atrial fibrillation, no LE edema, no dizzy spells, no syncope             Respiratory:                 + exertional shortness of breath, no home oxygen, no productive cough, no dry cough, no bronchitis, no wheezing, no hemoptysis, no asthma, no pain with inspiration or cough, + sleep apnea, no CPAP at night             GI:                               no difficulty swallowing, no reflux, no frequent heartburn, no hiatal hernia, no abdominal pain, no constipation, no diarrhea, no hematochezia, no hematemesis, no melena             GU:                              no dysuria,  no frequency, no urinary tract infection, no hematuria, no enlarged prostate, no kidney stones, no kidney disease             Vascular:                     no pain suggestive of claudication, no pain in feet, no leg cramps, no varicose veins, no DVT, no non-healing foot ulcer             Neuro:                         no stroke, no TIA's, no seizures, no headaches, no temporary blindness one eye,  no slurred speech, no peripheral neuropathy, no chronic pain, no instability of gait, no memory/cognitive dysfunction             Musculoskeletal:         no arthritis, no joint swelling, no myalgias, no difficulty walking, normal mobility             Skin:                            no rash, no itching, no skin infections, no pressure sores or ulcerations             Psych:                         no anxiety, no depression, no nervousness, no unusual recent stress             Eyes:                           no blurry vision, no floaters, no recent vision changes, + wears glasses             ENT:  no hearing loss, no loose or painful teeth, no dentures, last saw dentist 04/10/20             Hematologic:                no easy bruising, no abnormal bleeding, no clotting disorder, no frequent epistaxis             Endocrine:                   no diabetes, does not check CBG's at home                             Physical Exam:               BP (!) 170/90   Pulse 85   Resp 20   Ht 6' (1.829 m)   Wt 99.8 kg   SpO2 95% Comment: RA  BMI 29.84 kg/m              General:                      well-appearing             HEENT:                       Unremarkable, NCAT, PERLA, EOMI,              Neck:                           no JVD, no bruits, no adenopathy             Chest:                          clear to auscultation, symmetrical breath sounds, no wheezes, no rhonchi             CV:                              RRR, grade lll/VI crescendo/decrescendo murmur heard best at RSB,  no diastolic murmur             Abdomen:                    soft, non-tender, no masses             Extremities:                 warm, well-perfused, pulses palpable at ankle, no LE edema             Rectal/GU                   Deferred             Neuro:                         Grossly non-focal and symmetrical throughout             Skin:                            Clean and dry, no rashes, no breakdown     Diagnostic Tests:   Patient Name:  Dayton Bailiff III Date of Exam: 03/29/2020  Medical Rec #:  [id number]            Height:  Accession #:    [id number]           Weight:  Date of Birth:  07-10-1945            BSA:  Patient Age:    74 years             BP:           177/88 mmHg  Patient Gender: M                    HR:           71 bpm.  Exam Location:  Inpatient   Procedure: 2D Echo, Cardiac Doppler, Color Doppler and Intracardiac             Opacification Agent   Indications:     Aortic stenosis I35.0     History:         Patient has no prior history of Echocardiogram  examinations.                   Risk Factors:Hypertension, Dyslipidemia and Non-Smoker.     Sonographer:     Renella Cunas RDCS  Referring  Phys:  [id number] Orpah Cobb  Diagnosing Phys: Orpah Cobb MD   IMPRESSIONS     1. Left ventricular ejection fraction, by estimation, is 70 to 75%. The  left ventricle has hyperdynamic function. The left ventricle has no  regional wall motion abnormalities. There is moderate concentric left  ventricular hypertrophy. Left ventricular  diastolic parameters are consistent with Grade I diastolic dysfunction  (impaired relaxation).   2. Right ventricular systolic function is normal. The right ventricular  size is normal.   3. Left atrial size was mildly dilated.   4. Right atrial size was mildly dilated.   5. The mitral valve is degenerative. Mild mitral valve regurgitation.  Moderate mitral annular calcification.   6. The aortic valve is tricuspid. Aortic valve regurgitation is trivial.  Mild to moderate aortic valve stenosis.   7. There is mild (Grade II) atheroma plaque involving the aortic root and  ascending aorta.   8. The inferior vena cava is normal in size with greater than 50%  respiratory variability, suggesting right atrial pressure of 3 mmHg.   FINDINGS   Left Ventricle: Left ventricular ejection fraction, by estimation, is 70  to 75%. The left ventricle has hyperdynamic function. The left ventricle  has no regional wall motion abnormalities. Definity contrast agent was  given IV to delineate the left  ventricular endocardial borders. The left ventricular internal cavity size  was normal in size. There is moderate concentric left ventricular  hypertrophy. Left ventricular diastolic parameters are consistent with  Grade I diastolic dysfunction (impaired  relaxation).   Right Ventricle: The right ventricular size is normal. No increase in  right ventricular wall thickness. Right ventricular systolic function is  normal.   Left Atrium: Left atrial size was mildly dilated.   Right Atrium: Right atrial size was mildly dilated.   Pericardium: There is no evidence of  pericardial effusion.   Mitral Valve: The mitral valve is degenerative in appearance. There is  mild thickening of the mitral valve leaflet(s). There is mild  calcification of the mitral valve leaflet(s). Moderate mitral annular  calcification. Mild mitral valve regurgitation.   Tricuspid Valve: The tricuspid valve  is normal in structure. Tricuspid  valve regurgitation is mild.   Aortic Valve: The aortic valve is tricuspid. Aortic valve regurgitation is  trivial. Mild to moderate aortic stenosis is present. Aortic valve mean  gradient measures 15.0 mmHg. Aortic valve peak gradient measures 28.5  mmHg.   Pulmonic Valve: The pulmonic valve was normal in structure. Pulmonic valve  regurgitation is not visualized.   Aorta: The aortic root is normal in size and structure. There is mild  (Grade II) atheroma plaque involving the aortic root and ascending aorta.   Venous: The inferior vena cava is normal in size with greater than 50%  respiratory variability, suggesting right atrial pressure of 3 mmHg.   IAS/Shunts: The interatrial septum was not well visualized.      LEFT VENTRICLE  PLAX 2D  LVIDd:         5.20 cm      Diastology  LVIDs:         3.80 cm      LV e' medial:    3.68 cm/s  LV PW:         1.70 cm      LV E/e' medial:  21.3  LV IVS:        1.70 cm      LV e' lateral:   4.13 cm/s  LVOT diam:     2.60 cm      LV E/e' lateral: 19.0  LVOT Area:     5.31 cm     LV Volumes (MOD)  LV vol d, MOD A2C: 135.0 ml  LV vol d, MOD A4C: 202.0 ml  LV vol s, MOD A2C: 32.1 ml  LV vol s, MOD A4C: 70.4 ml  LV SV MOD A2C:     102.9 ml  LV SV MOD A4C:     202.0 ml  LV SV MOD BP:      112.8 ml   RIGHT VENTRICLE  RV S prime:     14.90 cm/s  TAPSE (M-mode): 2.0 cm   LEFT ATRIUM             RIGHT ATRIUM  LA diam:        5.00 cm RA Area:     15.10 cm  LA Vol (A2C):   78.9 ml RA Volume:   38.90 ml  LA Vol (A4C):   90.0 ml  LA Biplane Vol: 83.3 ml   AORTIC VALVE  AV Vmax:      267.00  cm/s  AV Vmean:     176.667 cm/s  AV VTI:       0.594 m  AV Peak Grad: 28.5 mmHg  AV Mean Grad: 15.0 mmHg     AORTA  Ao Root diam: 3.80 cm  Ao Asc diam:  3.50 cm   MITRAL VALVE  MV Area (PHT): 2.80 cm    SHUNTS  MV Decel Time: 271 msec    Systemic Diam: 2.60 cm  MV E velocity: 78.40 cm/s  MV A velocity: 98.10 cm/s  MV E/A ratio:  0.80   Orpah Cobb MD  Electronically signed by Orpah Cobb MD  Signature Date/Time: 03/29/2020/8:47:57 AM      TRANSESOPHOGEAL ECHO REPORT         Patient Name:   Dayton Bailiff III Date of Exam: 03/29/2020  Medical Rec #:  [id number]            Height:  Accession #:    [id number]  Weight:  Date of Birth:  1945/05/20            BSA:  Patient Age:    74 years             BP:           188/82 mmHg  Patient Gender: M                    HR:           74 bpm.  Exam Location:  Inpatient   Procedure: Transesophageal Echo, 3D Echo, Color Doppler and Cardiac  Doppler   Indications:    Aortic Stenosis     History:        Patient has prior history of Echocardiogram examinations,  most                  recent 03/29/2020. Risk Factors:Hypertension and ETOH.     Sonographer:    Eulah Pont RDCS  Referring Phys: [id number] Orpah Cobb   PROCEDURE: After discussion of the risks and benefits of a TEE, an  informed consent was obtained from the patient. The transesophogeal probe  was passed without difficulty through the esophogus of the patient.  Sedation performed by different physician.  The patient was monitored while under deep sedation. Anesthestetic  sedation was provided intravenously by Anesthesiology:  of Propofol,   of Lidocaine. The patient's vital signs; including heart rate, blood  pressure, and oxygen saturation;  remained stable throughout the procedure. The patient developed no  complications during the procedure.   IMPRESSIONS     1. Left ventricular ejection fraction, by estimation, is 70 to 75%. The  left  ventricle has hyperdynamic function. The left ventricle has no  regional wall motion abnormalities. There is severe concentric left  ventricular hypertrophy.   2. Right ventricular systolic function is normal. The right ventricular  size is normal.   3. Left atrial size was moderately dilated. No left atrial/left atrial  appendage thrombus was detected.   4. Systolic anterior leaflet motion. The mitral valve is degenerative.  Moderate mitral valve regurgitation. No evidence of mitral stenosis.   5. The aortic valve is calcified. There is severe calcifcation of the  aortic valve. There is severe thickening of the aortic valve. Aortic valve  regurgitation is mild. Severe aortic valve stenosis.   6. The inferior vena cava is normal in size with greater than 50%  respiratory variability, suggesting right atrial pressure of 3 mmHg.   FINDINGS   Left Ventricle: Left ventricular ejection fraction, by estimation, is 70  to 75%. The left ventricle has hyperdynamic function. The left ventricle  has no regional wall motion abnormalities. The left ventricular internal  cavity size was normal in size.  There is severe concentric left ventricular hypertrophy.   Right Ventricle: The right ventricular size is normal. No increase in  right ventricular wall thickness. Right ventricular systolic function is  normal.   Left Atrium: Left atrial size was moderately dilated. No left atrial/left  atrial appendage thrombus was detected.   Right Atrium: Right atrial size was normal in size.   Pericardium: There is no evidence of pericardial effusion.   Mitral Valve: Systolic anterior leaflet motion. The mitral valve is  degenerative in appearance. There is moderate thickening of the mitral  valve leaflet(s). There is moderate calcification of the mitral valve  leaflet(s). Mild mitral annular  calcification. Moderate mitral valve regurgitation, with  centrally-directed jet. No evidence of mitral  valve  stenosis.   Tricuspid Valve: The tricuspid valve is normal in structure. Tricuspid  valve regurgitation is mild.   Aortic Valve: The aortic valve is calcified. There is severe calcifcation  of the aortic valve. There is severe thickening of the aortic valve. There  is moderate aortic valve annular calcification. Aortic valve regurgitation  is mild. Severe aortic  stenosis is present. Aortic valve mean gradient measures 30.0 mmHg. Aortic  valve peak gradient measures 65.0 mmHg.   Pulmonic Valve: The pulmonic valve was normal in structure. Pulmonic valve  regurgitation is not visualized.   Aorta: The aortic root is normal in size and structure. There is minimal  (Grade I) atheroma plaque involving the aortic root, ascending aorta and  descending aorta.   Venous: The left lower pulmonary vein and right lower pulmonary vein are  normal. The inferior vena cava is normal in size with greater than 50%  respiratory variability, suggesting right atrial pressure of 3 mmHg.   IAS/Shunts: No atrial level shunt detected by color flow Doppler.   Additional Comments: A device lead is visualized in the right atrium and  superior vena cava.      AORTIC VALVE  AV Vmax:           403.00 cm/s  AV Vmean:          249.000 cm/s  AV VTI:            0.763 m  AV Peak Grad:      65.0 mmHg  AV Mean Grad:      30.0 mmHg  LVOT Vmax:         205.00 cm/s  LVOT Vmean:        103.000 cm/s  LVOT VTI:          0.226 m  LVOT/AV VTI ratio: 0.30      SHUNTS  Systemic VTI: 0.23 m   Orpah Cobb MD  Electronically signed by Orpah Cobb MD  Signature Date/Time: 03/29/2020/7:19:03 PM      Physicians   Panel Physicians Referring Physician Case Authorizing Physician  Orpah Cobb, MD (Primary)          Procedures   LEFT HEART CATH AND CORONARY ANGIOGRAPHY    Conclusion     Mid RCA lesion is 40% stenosed. Previously placed Mid LAD stent (unknown type) is widely patent.   AV replacement +/- HOCM  treatment Surgery or TAVR.       Recommendations   Antiplatelet/Anticoag No indication for antiplatelet therapy at this time .    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  CLINICAL HISTORY:  This is a 75 years old male with CAD, LAD stent, severe AS has chest pain and exertional dyspnea..  The risks, benefits, and details of the procedure were explained to the patient.  The patient verbalized understanding and wanted to proceed.  Informed written consent was obtained.  PROCEDURE TECHNIQUE:  The patient was approached from the right femoral artery using a 5 French short sheath.  Left coronary angiography was done using a Judkins L4  guide catheter.  Right coronary angiography was done using a Judkins R4 guide catheter.  Left ventriculography was not done using a pigtail catheter.    CONTRAST:  Total of 60 cc.  Total sedation time : - 26 mins.  I was present 100 % of the time face to face during the procedure.  Estimated blood loss <50 mL.   During this procedure medications were administered to achieve and maintain moderate conscious sedation while the patient's heart rate, blood pressure, and oxygen saturation were continuously monitored and I was present face-to-face 100% of this time.    Medications (Filter: Administrations occurring from 1020 to 1135 on 03/30/20)  (important)  Continuous medications are totaled by the amount administered until 03/30/20 1135.      midazolam (VERSED) injection  (mg) Total dose:  2 mg   Date/Time Rate/Dose/Volume Action    03/30/20 1059 1 mg Given    1104 1 mg Given      fentaNYL (SUBLIMAZE) injection (mcg) Total dose:  50 mcg   Date/Time Rate/Dose/Volume Action    03/30/20 1059 25 mcg Given    1104 25 mcg Given      lidocaine (PF) (XYLOCAINE) 1 % injection (mL) Total volume:  15 mL   Date/Time Rate/Dose/Volume Action    03/30/20 1104 15 mL Given      iodixanol (VISIPAQUE) 320 MG/ML injection (mL) Total volume:  60 mL   Date/Time Rate/Dose/Volume Action    03/30/20 1127 60 mL Given      Sedation Time   Sedation Time Physician-1: 25 minutes 46 seconds     Contrast   Medication Name Total Dose  iodixanol (VISIPAQUE) 320 MG/ML injection 60 mL      Radiation/Fluoro   Fluoro time: 5.2 (min) DAP: 50196 (mGycm2) Cumulative Air Kerma: 554 (mGy)     Complications     Complications documented before study signed (03/30/2020 12:00 PM)      No complications were associated with this study.  Documented by Orpah Cobb, MD - 03/30/2020 11:53 AM      Coronary Findings     Diagnostic Dominance: Right   Left Main  Vessel was injected. Vessel is normal in caliber. Vessel is angiographically normal. Very short vessel  Left Anterior Descending  Vessel was injected. Vessel is moderate in size. Vessel is angiographically normal.  Previously placed Mid LAD stent (unknown type) is widely patent. Vessel is not the culprit lesion. The lesion is type A. Previously placed stent displays no rethrombosis. Previously placed stent displays no restenosis. The stenosis was measured by a visual reading. Pressure wire/FFR was not performed on the lesion. IVUS was not performed. No optical coherence tomography (OCT) was performed.  Left Circumflex  Vessel was injected. Vessel is normal in caliber. Vessel is angiographically normal.  Right Coronary Artery  Vessel was injected. Vessel is normal in caliber. Vessel is angiographically normal.  Mid  RCA lesion is 40% stenosed. Vessel is not the culprit lesion. The lesion is type A and eccentric. The lesion was not previously treated. The stenosis was measured by a visual reading. Pressure wire/FFR was not performed on the lesion. IVUS was not performed. No optical coherence tomography (OCT) was performed.    Intervention     No interventions have been documented.   Wall Motion        Resting          All segments of the heart are normal.  Severe LVH, OT obstruction and normal LV systolic function.  Coronary Diagrams     Diagnostic Dominance: Right      Intervention       Implants         No implant documentation for this case.    Syngo Images    Show images for CARDIAC CATHETERIZATION   Images on Long Term Storage    Show images for Azahel, Belcastro III   Link to Procedure Log   Procedure Log      Hemo Data   Flowsheet Row Most Recent Value  AO Systolic Pressure 108 mmHg  AO Diastolic Pressure 55 mmHg  AO Mean 77 mmHg      ADDENDUM REPORT: 04/16/2020 09:27   CLINICAL DATA:  Pre-op transcatheter aortic valve replacement (TAVR)   EXAM: Cardiac TAVR CT   TECHNIQUE: The patient was scanned on a Siemens Force 192 slice scanner. A 120 kV retrospective scan was triggered in the descending thoracic aorta at 111 HU's. Gantry rotation speed was 270 msecs and collimation was .9 mm. The 3D data set was reconstructed in 5% intervals of the R-R cycle. Systolic and diastolic phases were analyzed on a dedicated work station using MPR, MIP and VRT modes. The patient received OMNIPAQUE IOHEXOL 350 MG/ML SOLN of contrast.   FINDINGS: Aortic Valve: Tricuspid aortic valve. Moderate-severely reduced cusp separation. Moderately thickened, severely calcified aortic valve cusps.   AV calcium score: 2314   Virtual Basal Annulus Measurements:   Maximum/Minimum Diameter: 30.6 x 22.7 mm   Perimeter: 85.2 mm   Area: 551 mm2   Trivial  LVOT calcifications below non-coronary cusp.   Based on these measurements, the annulus would be suitable for a 29 mm Sapien 3 valve.   Sinus of Valsalva Measurements:   Non-coronary:  36 mm   Right - coronary:  35 mm   Left - coronary:  37 mm   Sinus of Valsalva Height:   Left: 21.2 mm   Right: 24 mm   Aorta: Moderate thoracic aorta calcifications. Conventional three vessel branch pattern of aortic arch.   Sinotubular Junction:  28 mm   Ascending Thoracic Aorta:  34 mm   Aortic Arch:  29 mm   Descending Thoracic Aorta:  27 mm   Coronary Artery Height above Annulus:   Left Main: 16.1 mm   Right Coronary: 21 mm   Coronary Arteries: 3 vessel coronary artery calcifications   Optimum Fluoroscopic Angle for Delivery: LAO 13, CAU 14   Dual chamber pacemaker in place, leads seen in RA and RV.   No LA appendage thrombus.   IMPRESSION: 1. Tricuspid aortic valve. Moderate-severely reduced cusp separation. Moderately thickened, severely calcified aortic valve cusps.   2.  AV calcium score: 2314   3.  Annulus area: 551 mm2, suitable for 29 mm Sapien 3 valve.   4.  Adequate coronary artery height from annulus.   5. Normal caliber thoracic aorta. Moderate aortic atherosclerosis and calcification.   6. Optimum Fluoroscopic Angle for Delivery: LAO 13, CAU 14     Electronically Signed   By: Weston Brass   On: 04/16/2020 09:27   Narrative & Impression  CLINICAL DATA:  75 year old male with history of severe aortic stenosis. Preprocedural study prior to potential transcatheter aortic valve replacement (TAVR).   EXAM: CT ANGIOGRAPHY CHEST, ABDOMEN AND PELVIS   TECHNIQUE: Multidetector CT imaging through the chest, abdomen and pelvis was performed using the standard protocol during bolus administration of intravenous contrast. Multiplanar reconstructed images and MIPs were obtained and reviewed to evaluate  the vascular anatomy.   CONTRAST:  100mL  OMNIPAQUE IOHEXOL 350 MG/ML SOLN   COMPARISON:  Chest CTA 03/29/2020.   FINDINGS: CTA CHEST FINDINGS   Cardiovascular: Heart size is borderline enlarged. There is no significant pericardial fluid, thickening or pericardial calcification. There is aortic atherosclerosis, as well as atherosclerosis of the great vessels of the mediastinum and the coronary arteries, including calcified atherosclerotic plaque in the left main, left anterior descending, left circumflex and right coronary arteries. Severe thickening and calcification of the aortic valve. Moderate calcifications of the mitral annulus. Left-sided pacemaker/AICD with lead tips terminating in the right atrium and right ventricular apex.   Mediastinum/Lymph Nodes: Multiple prominent borderline enlarged mediastinal lymph nodes are noted, but nonspecific. No hilar lymphadenopathy. Esophagus is unremarkable in appearance. No axillary lymphadenopathy.   Lungs/Pleura: No suspicious appearing pulmonary nodules or masses are noted. No acute consolidative airspace disease. No pleural effusions. Areas of mild bibasilar scarring.   Musculoskeletal/Soft Tissues: Orthopedic fixation hardware in the cervical spine. There are no aggressive appearing lytic or blastic lesions noted in the visualized portions of the skeleton.   CTA ABDOMEN AND PELVIS FINDINGS   Hepatobiliary: Diffuse low attenuation throughout the hepatic parenchyma, indicative of hepatic steatosis. No suspicious cystic or solid hepatic lesions. No intra or extrahepatic biliary ductal dilatation. Gallbladder is normal in appearance.   Pancreas: No pancreatic mass. No pancreatic ductal dilatation. No pancreatic or peripancreatic fluid collections or inflammatory changes.   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 Otherwise, unremarkable.   Adrenals/Urinary Tract: 1.3 cm intermediate attenuation lesion in the interpolar  region of the left kidney, incompletely characterized on today's examination. Subcentimeter low-attenuation lesions in the lower pole of the right kidney, too small to characterize, but statistically likely to represent cysts. Multifocal cortical thinning and mild generalized parenchymal atrophy in both kidneys. Bilateral adrenal glands are normal in appearance. No hydroureteronephrosis. Urinary bladder is unremarkable in appearance.   Stomach/Bowel: The appearance of the stomach is normal. No pathologic dilatation of small bowel or colon. Normal appendix.   Vascular/Lymphatic: Aortic atherosclerosis, without evidence of aneurysm or dissection in the abdominal or pelvic vasculature. No lymphadenopathy noted in the abdomen or pelvis.   Reproductive: Prostate gland and seminal vesicles are unremarkable in appearance.   Other: Esophagus is unremarkable in appearance. No axillary lymphadenopathy.   Musculoskeletal: There are no aggressive appearing lytic or blastic lesions noted in the visualized portions of the skeleton.   VASCULAR MEASUREMENTS PERTINENT TO TAVR:   AORTA:   Minimal Aortic Diameter-17 x 14 mm   Severity of Aortic Calcification-moderate   RIGHT PELVIS:   Right Common Iliac Artery -   Minimal Diameter-10.7 x 10.5 mm   Tortuosity-mild   Calcification-mild   Right External Iliac Artery -   Minimal Diameter-8.1 x 8.7 mm   Tortuosity-moderate to severe   Calcification-none   Right Common Femoral Artery -   Minimal Diameter-6.8 x 8.7 mm   Tortuosity-mild   Calcification-mild   LEFT PELVIS:   Left Common Iliac Artery -   Minimal Diameter-11.5 x 8.9 mm   Tortuosity-mild   Calcification-mild   Left External Iliac Artery -   Minimal Diameter-8.5 x 8.0 mm   Tortuosity-severe   Calcification-none   Left Common Femoral Artery -   Minimal Diameter-8.3 x 5.4 mm   Tortuosity-mild   Calcification-mild   Review of the MIP images confirms  the above findings.   IMPRESSION: 1. Vascular findings and measurements pertinent to potential TAVR procedure, as  detailed above. 2. Severe thickening calcification of the aortic valve, compatible with reported clinical history of severe aortic stenosis. 3. Aortic atherosclerosis, in addition to left main and 3 vessel coronary artery disease. Assessment for potential risk factor modification, dietary therapy or pharmacologic therapy may be warranted, if clinically indicated. 4. Hepatic steatosis. 5. Indeterminate lesion in the interpolar region of the left kidney. This may simply represent a small proteinaceous/hemorrhagic cyst. This could be definitively characterized with follow-up nonemergent abdominal MRI with and without IV gadolinium if of clinical concern. Alternatively, attention on follow-up hematuria protocol CT scan should be considered in 6 months. 6. Additional incidental findings, as above.     Electronically Signed   By: Trudie Reed M.D.   On: 04/14/2020 07:08      STS Risk Calculator: Isolated AVR: Risk of Mortality: 1.636% Renal Failure: 3.324% Permanent Stroke: 1.069% Prolonged Ventilation: 5.170% DSW Infection: 0.099% Reoperation: 4.496% Morbidity or Mortality: 9.741% Short Length of Stay: 42.523% Long Length of Stay: 4.084%   Impression:    This 75 year old gentleman has stage D, moderate to severe, symptomatic aortic stenosis with New York Heart Association class II-III symptoms of exertional fatigue and shortness of breath consistent with chronic diastolic congestive heart failure.  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 It is felt that the best treatment is to perform AVR with septal myectomy and hopefully this will resolve his SAM and MR. It is still possible that he may require MV repair or replacement but I think that  is unlikely. I discussed the operative procedure with the patient and his son including alternatives, benefits and risks; including but not limited to bleeding, blood transfusion, infection, stroke, myocardial infarction, graft failure, heart block requiring a permanent pacemaker, organ dysfunction, and death.  Dayton Bailiff III understands and agrees to proceed.       Plan:    AVR and septal myectomy, possible MV repair or replacement.      [name], MD

## 2020-07-08 NOTE — Anesthesia Preprocedure Evaluation (Addendum)
Anesthesia Evaluation  Patient identified by MRN, date of birth, ID band Patient awake    Reviewed: Allergy & Precautions, NPO status , Patient's Chart, lab work & pertinent test results  Airway Mallampati: II  TM Distance: >3 FB Neck ROM: Full    Dental  (+) Teeth Intact   Pulmonary sleep apnea , former smoker,    Pulmonary exam normal        Cardiovascular hypertension, Pt. on medications and Pt. on home beta blockers +CHF  + pacemaker + Valvular Problems/Murmurs AS and MR  Rhythm:Regular Rate:Normal + Systolic murmurs    Neuro/Psych Depression negative neurological ROS     GI/Hepatic GERD  Medicated,(+)     substance abuse  alcohol use,   Endo/Other  negative endocrine ROS  Renal/GU negative Renal ROS  negative genitourinary   Musculoskeletal negative musculoskeletal ROS (+)   Abdominal (+)  Abdomen: soft.    Peds  Hematology  (+) anemia ,   Anesthesia Other Findings   Reproductive/Obstetrics                            Anesthesia Physical Anesthesia Plan  ASA: 4  Anesthesia Plan: General   Post-op Pain Management:    Induction: Intravenous  PONV Risk Score and Plan: 2 and Midazolam and Treatment may vary due to age or medical condition  Airway Management Planned: Mask and Oral ETT  Additional Equipment: Arterial line, CVP, PA Cath, 3D TEE, TEE and Ultrasound Guidance Line Placement  Intra-op Plan:   Post-operative Plan: Post-operative intubation/ventilation  Informed Consent: I have reviewed the patients History and Physical, chart, labs and discussed the procedure including the risks, benefits and alternatives for the proposed anesthesia with the patient or authorized representative who has indicated his/her understanding and acceptance.     Dental advisory given  Plan Discussed with: CRNA  Anesthesia Plan Comments: (ECHO 03/29/20: IMPRESSIONS  1. Left  ventricular ejection fraction, by estimation, is 70 to 75%. The  left ventricle has hyperdynamic function. The left ventricle has no  regional wall motion abnormalities. There is moderate concentric left  ventricular hypertrophy. Left ventricular  diastolic parameters are consistent with Grade I diastolic dysfunction  (impaired relaxation).  2. Right ventricular systolic function is normal. The right ventricular  size is normal.  3. Left atrial size was mildly dilated.  4. Right atrial size was mildly dilated.  5. The mitral valve is degenerative. Mild mitral valve regurgitation.  Moderate mitral annular calcification.  6. The aortic valve is tricuspid. Aortic valve regurgitation is trivial.  Mild to moderate aortic valve stenosis.  7. There is mild (Grade II) atheroma plaque involving the aortic root and  ascending aorta.  8. The inferior vena cava is normal in size with greater than 50%  respiratory variability, suggesting right atrial pressure of 3 mmHg.  )       Anesthesia Quick Evaluation

## 2020-07-09 ENCOUNTER — Encounter (HOSPITAL_COMMUNITY)
Admission: RE | Disposition: A | Discharge status: Home / Self Care | Payer: Self-pay | Source: Home / Self Care | Attending: Surgery

## 2020-07-09 ENCOUNTER — Inpatient Hospital Stay (HOSPITAL_COMMUNITY): Payer: Medicare HMO | Admitting: Anesthesiology

## 2020-07-09 ENCOUNTER — Inpatient Hospital Stay (HOSPITAL_COMMUNITY): Payer: Medicare HMO

## 2020-07-09 ENCOUNTER — Inpatient Hospital Stay (HOSPITAL_COMMUNITY)
Admission: RE | Admit: 2020-07-09 | Discharge: 2020-07-17 | DRG: 220 | Disposition: A | Discharge status: Home / Self Care | Payer: Medicare HMO | Attending: Surgery | Admitting: Surgery

## 2020-07-09 ENCOUNTER — Inpatient Hospital Stay (HOSPITAL_COMMUNITY): Payer: Medicare HMO | Admitting: Vascular Surgery

## 2020-07-09 ENCOUNTER — Other Ambulatory Visit: Payer: Self-pay

## 2020-07-09 ENCOUNTER — Encounter (HOSPITAL_COMMUNITY): Payer: Self-pay | Admitting: Surgery

## 2020-07-09 DIAGNOSIS — I350 Nonrheumatic aortic (valve) stenosis: Secondary | ICD-10-CM

## 2020-07-09 DIAGNOSIS — Z950 Presence of cardiac pacemaker: Secondary | ICD-10-CM

## 2020-07-09 DIAGNOSIS — Z87891 Personal history of nicotine dependence: Secondary | ICD-10-CM

## 2020-07-09 DIAGNOSIS — Z09 Encounter for follow-up examination after completed treatment for conditions other than malignant neoplasm: Secondary | ICD-10-CM

## 2020-07-09 DIAGNOSIS — M19041 Primary osteoarthritis, right hand: Secondary | ICD-10-CM | POA: Diagnosis present

## 2020-07-09 DIAGNOSIS — F32A Depression, unspecified: Secondary | ICD-10-CM | POA: Diagnosis present

## 2020-07-09 DIAGNOSIS — K219 Gastro-esophageal reflux disease without esophagitis: Secondary | ICD-10-CM | POA: Diagnosis present

## 2020-07-09 DIAGNOSIS — Z952 Presence of prosthetic heart valve: Secondary | ICD-10-CM

## 2020-07-09 DIAGNOSIS — I25110 Atherosclerotic heart disease of native coronary artery with unstable angina pectoris: Secondary | ICD-10-CM | POA: Diagnosis present

## 2020-07-09 DIAGNOSIS — E785 Hyperlipidemia, unspecified: Secondary | ICD-10-CM | POA: Diagnosis present

## 2020-07-09 DIAGNOSIS — Z79899 Other long term (current) drug therapy: Secondary | ICD-10-CM

## 2020-07-09 DIAGNOSIS — Z981 Arthrodesis status: Secondary | ICD-10-CM

## 2020-07-09 DIAGNOSIS — I249 Acute ischemic heart disease, unspecified: Secondary | ICD-10-CM | POA: Diagnosis not present

## 2020-07-09 DIAGNOSIS — J9811 Atelectasis: Secondary | ICD-10-CM | POA: Diagnosis not present

## 2020-07-09 DIAGNOSIS — I5032 Chronic diastolic (congestive) heart failure: Secondary | ICD-10-CM | POA: Diagnosis present

## 2020-07-09 DIAGNOSIS — Z91013 Allergy to seafood: Secondary | ICD-10-CM | POA: Diagnosis not present

## 2020-07-09 DIAGNOSIS — M25441 Effusion, right hand: Secondary | ICD-10-CM

## 2020-07-09 DIAGNOSIS — R0902 Hypoxemia: Secondary | ICD-10-CM | POA: Diagnosis not present

## 2020-07-09 DIAGNOSIS — I517 Cardiomegaly: Secondary | ICD-10-CM

## 2020-07-09 DIAGNOSIS — F1011 Alcohol abuse, in remission: Secondary | ICD-10-CM | POA: Diagnosis present

## 2020-07-09 DIAGNOSIS — D62 Acute posthemorrhagic anemia: Secondary | ICD-10-CM | POA: Diagnosis not present

## 2020-07-09 DIAGNOSIS — I340 Nonrheumatic mitral (valve) insufficiency: Secondary | ICD-10-CM | POA: Diagnosis present

## 2020-07-09 DIAGNOSIS — R001 Bradycardia, unspecified: Secondary | ICD-10-CM | POA: Diagnosis not present

## 2020-07-09 DIAGNOSIS — I110 Hypertensive heart disease with heart failure: Secondary | ICD-10-CM | POA: Diagnosis present

## 2020-07-09 DIAGNOSIS — R739 Hyperglycemia, unspecified: Secondary | ICD-10-CM | POA: Diagnosis not present

## 2020-07-09 DIAGNOSIS — R410 Disorientation, unspecified: Secondary | ICD-10-CM | POA: Diagnosis not present

## 2020-07-09 DIAGNOSIS — Z888 Allergy status to other drugs, medicaments and biological substances status: Secondary | ICD-10-CM | POA: Diagnosis not present

## 2020-07-09 DIAGNOSIS — I4891 Unspecified atrial fibrillation: Secondary | ICD-10-CM | POA: Diagnosis not present

## 2020-07-09 DIAGNOSIS — Z7982 Long term (current) use of aspirin: Secondary | ICD-10-CM

## 2020-07-09 HISTORY — PX: AORTIC VALVE REPLACEMENT: SHX41

## 2020-07-09 HISTORY — PX: TEE WITHOUT CARDIOVERSION: SHX5443

## 2020-07-09 LAB — POCT I-STAT, CHEM 8
BUN: 22 mg/dL (ref 8–23)
BUN: 20 mg/dL (ref 8–23)
BUN: 19 mg/dL (ref 8–23)
BUN: 19 mg/dL (ref 8–23)
Calcium, Ion: 1.31 mmol/L (ref 1.15–1.40)
Calcium, Ion: 1.18 mmol/L (ref 1.15–1.40)
Calcium, Ion: 1.06 mmol/L — ABNORMAL LOW (ref 1.15–1.40)
Calcium, Ion: 1.1 mmol/L — ABNORMAL LOW (ref 1.15–1.40)
Chloride: 105 mmol/L (ref 98–111)
Chloride: 106 mmol/L (ref 98–111)
Chloride: 106 mmol/L (ref 98–111)
Chloride: 107 mmol/L (ref 98–111)
Creatinine, Ser: 1.2 mg/dL (ref 0.61–1.24)
Creatinine, Ser: 1.1 mg/dL (ref 0.61–1.24)
Creatinine, Ser: 1 mg/dL (ref 0.61–1.24)
Creatinine, Ser: 1 mg/dL (ref 0.61–1.24)
Glucose, Bld: 115 mg/dL — ABNORMAL HIGH (ref 70–99)
Glucose, Bld: 127 mg/dL — ABNORMAL HIGH (ref 70–99)
Glucose, Bld: 118 mg/dL — ABNORMAL HIGH (ref 70–99)
Glucose, Bld: 126 mg/dL — ABNORMAL HIGH (ref 70–99)
HCT: 36 % — ABNORMAL LOW (ref 39.0–52.0)
HCT: 32 % — ABNORMAL LOW (ref 39.0–52.0)
HCT: 26 % — ABNORMAL LOW (ref 39.0–52.0)
HCT: 27 % — ABNORMAL LOW (ref 39.0–52.0)
Hemoglobin: 12.2 g/dL — ABNORMAL LOW (ref 13.0–17.0)
Hemoglobin: 10.9 g/dL — ABNORMAL LOW (ref 13.0–17.0)
Hemoglobin: 8.8 g/dL — ABNORMAL LOW (ref 13.0–17.0)
Hemoglobin: 9.2 g/dL — ABNORMAL LOW (ref 13.0–17.0)
Potassium: 4.4 mmol/L (ref 3.5–5.1)
Potassium: 4.4 mmol/L (ref 3.5–5.1)
Potassium: 5 mmol/L (ref 3.5–5.1)
Potassium: 5.3 mmol/L — ABNORMAL HIGH (ref 3.5–5.1)
Sodium: 140 mmol/L (ref 135–145)
Sodium: 138 mmol/L (ref 135–145)
Sodium: 138 mmol/L (ref 135–145)
Sodium: 138 mmol/L (ref 135–145)
TCO2: 24 mmol/L (ref 22–32)
TCO2: 24 mmol/L (ref 22–32)
TCO2: 26 mmol/L (ref 22–32)
TCO2: 26 mmol/L (ref 22–32)

## 2020-07-09 LAB — POCT I-STAT 7, (LYTES, BLD GAS, ICA,H+H)
Acid-Base Excess: 2 mmol/L (ref 0.0–2.0)
Acid-Base Excess: 2 mmol/L (ref 0.0–2.0)
Acid-base deficit: 1 mmol/L (ref 0.0–2.0)
Acid-base deficit: 3 mmol/L — ABNORMAL HIGH (ref 0.0–2.0)
Acid-base deficit: 2 mmol/L (ref 0.0–2.0)
Acid-base deficit: 1 mmol/L (ref 0.0–2.0)
Acid-base deficit: 3 mmol/L — ABNORMAL HIGH (ref 0.0–2.0)
Bicarbonate: 25.5 mmol/L (ref 20.0–28.0)
Bicarbonate: 26.9 mmol/L (ref 20.0–28.0)
Bicarbonate: 27.4 mmol/L (ref 20.0–28.0)
Bicarbonate: 23.6 mmol/L (ref 20.0–28.0)
Bicarbonate: 23.5 mmol/L (ref 20.0–28.0)
Bicarbonate: 24.1 mmol/L (ref 20.0–28.0)
Bicarbonate: 21 mmol/L (ref 20.0–28.0)
Calcium, Ion: 1.25 mmol/L (ref 1.15–1.40)
Calcium, Ion: 1.04 mmol/L — ABNORMAL LOW (ref 1.15–1.40)
Calcium, Ion: 1.06 mmol/L — ABNORMAL LOW (ref 1.15–1.40)
Calcium, Ion: 1.13 mmol/L — ABNORMAL LOW (ref 1.15–1.40)
Calcium, Ion: 1.11 mmol/L — ABNORMAL LOW (ref 1.15–1.40)
Calcium, Ion: 1.19 mmol/L (ref 1.15–1.40)
Calcium, Ion: 1.14 mmol/L — ABNORMAL LOW (ref 1.15–1.40)
HCT: 36 % — ABNORMAL LOW (ref 39.0–52.0)
HCT: 28 % — ABNORMAL LOW (ref 39.0–52.0)
HCT: 28 % — ABNORMAL LOW (ref 39.0–52.0)
HCT: 28 % — ABNORMAL LOW (ref 39.0–52.0)
HCT: 30 % — ABNORMAL LOW (ref 39.0–52.0)
HCT: 23 % — ABNORMAL LOW (ref 39.0–52.0)
HCT: 22 % — ABNORMAL LOW (ref 39.0–52.0)
Hemoglobin: 12.2 g/dL — ABNORMAL LOW (ref 13.0–17.0)
Hemoglobin: 9.5 g/dL — ABNORMAL LOW (ref 13.0–17.0)
Hemoglobin: 9.5 g/dL — ABNORMAL LOW (ref 13.0–17.0)
Hemoglobin: 9.5 g/dL — ABNORMAL LOW (ref 13.0–17.0)
Hemoglobin: 10.2 g/dL — ABNORMAL LOW (ref 13.0–17.0)
Hemoglobin: 7.8 g/dL — ABNORMAL LOW (ref 13.0–17.0)
Hemoglobin: 7.5 g/dL — ABNORMAL LOW (ref 13.0–17.0)
O2 Saturation: 97 %
O2 Saturation: 100 %
O2 Saturation: 100 %
O2 Saturation: 91 %
O2 Saturation: 97 %
O2 Saturation: 96 %
O2 Saturation: 89 %
Patient temperature: 36.4
Patient temperature: 36.9
Patient temperature: 37.3
Patient temperature: 37.4
Potassium: 4.5 mmol/L (ref 3.5–5.1)
Potassium: 4.6 mmol/L (ref 3.5–5.1)
Potassium: 5.2 mmol/L — ABNORMAL HIGH (ref 3.5–5.1)
Potassium: 4.5 mmol/L (ref 3.5–5.1)
Potassium: 4.9 mmol/L (ref 3.5–5.1)
Potassium: 4.5 mmol/L (ref 3.5–5.1)
Potassium: 4.5 mmol/L (ref 3.5–5.1)
Sodium: 140 mmol/L (ref 135–145)
Sodium: 138 mmol/L (ref 135–145)
Sodium: 139 mmol/L (ref 135–145)
Sodium: 140 mmol/L (ref 135–145)
Sodium: 140 mmol/L (ref 135–145)
Sodium: 139 mmol/L (ref 135–145)
Sodium: 140 mmol/L (ref 135–145)
TCO2: 27 mmol/L (ref 22–32)
TCO2: 28 mmol/L (ref 22–32)
TCO2: 29 mmol/L (ref 22–32)
TCO2: 25 mmol/L (ref 22–32)
TCO2: 25 mmol/L (ref 22–32)
TCO2: 25 mmol/L (ref 22–32)
TCO2: 22 mmol/L (ref 22–32)
pCO2 arterial: 49.6 mmHg — ABNORMAL HIGH (ref 32.0–48.0)
pCO2 arterial: 42.9 mmHg (ref 32.0–48.0)
pCO2 arterial: 44.3 mmHg (ref 32.0–48.0)
pCO2 arterial: 45.8 mmHg (ref 32.0–48.0)
pCO2 arterial: 40.3 mmHg (ref 32.0–48.0)
pCO2 arterial: 42.7 mmHg (ref 32.0–48.0)
pCO2 arterial: 34.3 mmHg (ref 32.0–48.0)
pH, Arterial: 7.319 — ABNORMAL LOW (ref 7.350–7.450)
pH, Arterial: 7.4 (ref 7.350–7.450)
pH, Arterial: 7.406 (ref 7.350–7.450)
pH, Arterial: 7.317 — ABNORMAL LOW (ref 7.350–7.450)
pH, Arterial: 7.373 (ref 7.350–7.450)
pH, Arterial: 7.361 (ref 7.350–7.450)
pH, Arterial: 7.397 (ref 7.350–7.450)
pO2, Arterial: 102 mmHg (ref 83.0–108.0)
pO2, Arterial: 296 mmHg — ABNORMAL HIGH (ref 83.0–108.0)
pO2, Arterial: 316 mmHg — ABNORMAL HIGH (ref 83.0–108.0)
pO2, Arterial: 65 mmHg — ABNORMAL LOW (ref 83.0–108.0)
pO2, Arterial: 90 mmHg (ref 83.0–108.0)
pO2, Arterial: 84 mmHg (ref 83.0–108.0)
pO2, Arterial: 57 mmHg — ABNORMAL LOW (ref 83.0–108.0)

## 2020-07-09 LAB — CBC
HCT: 30.8 % — ABNORMAL LOW (ref 39.0–52.0)
HCT: 26 % — ABNORMAL LOW (ref 39.0–52.0)
Hemoglobin: 9.9 g/dL — ABNORMAL LOW (ref 13.0–17.0)
Hemoglobin: 8.3 g/dL — ABNORMAL LOW (ref 13.0–17.0)
MCH: 30.3 pg (ref 26.0–34.0)
MCH: 29.9 pg (ref 26.0–34.0)
MCHC: 32.1 g/dL (ref 30.0–36.0)
MCHC: 31.9 g/dL (ref 30.0–36.0)
MCV: 94.2 fL (ref 80.0–100.0)
MCV: 93.5 fL (ref 80.0–100.0)
Platelets: 172 10*3/uL (ref 150–400)
Platelets: 163 10*3/uL (ref 150–400)
RBC: 3.27 MIL/uL — ABNORMAL LOW (ref 4.22–5.81)
RBC: 2.78 MIL/uL — ABNORMAL LOW (ref 4.22–5.81)
RDW: 13 % (ref 11.5–15.5)
RDW: 13.2 % (ref 11.5–15.5)
WBC: 15.6 10*3/uL — ABNORMAL HIGH (ref 4.0–10.5)
WBC: 9.6 10*3/uL (ref 4.0–10.5)
nRBC: 0 % (ref 0.0–0.2)
nRBC: 0 % (ref 0.0–0.2)

## 2020-07-09 LAB — BASIC METABOLIC PANEL
Anion gap: 7 (ref 5–15)
BUN: 19 mg/dL (ref 8–23)
CO2: 23 mmol/L (ref 22–32)
Calcium: 7.9 mg/dL — ABNORMAL LOW (ref 8.9–10.3)
Chloride: 108 mmol/L (ref 98–111)
Creatinine, Ser: 1.27 mg/dL — ABNORMAL HIGH (ref 0.61–1.24)
GFR, Estimated: 59 mL/min — ABNORMAL LOW (ref >60)
Glucose, Bld: 153 mg/dL — ABNORMAL HIGH (ref 70–99)
Potassium: 4.8 mmol/L (ref 3.5–5.1)
Sodium: 138 mmol/L (ref 135–145)

## 2020-07-09 LAB — MAGNESIUM: Magnesium: 3.5 mg/dL — ABNORMAL HIGH (ref 1.7–2.4)

## 2020-07-09 LAB — GLUCOSE, CAPILLARY
Glucose-Capillary: 114 mg/dL — ABNORMAL HIGH (ref 70–99)
Glucose-Capillary: 140 mg/dL — ABNORMAL HIGH (ref 70–99)
Glucose-Capillary: 142 mg/dL — ABNORMAL HIGH (ref 70–99)
Glucose-Capillary: 147 mg/dL — ABNORMAL HIGH (ref 70–99)
Glucose-Capillary: 152 mg/dL — ABNORMAL HIGH (ref 70–99)
Glucose-Capillary: 142 mg/dL — ABNORMAL HIGH (ref 70–99)
Glucose-Capillary: 175 mg/dL — ABNORMAL HIGH (ref 70–99)
Glucose-Capillary: 183 mg/dL — ABNORMAL HIGH (ref 70–99)

## 2020-07-09 LAB — ECHO INTRAOPERATIVE TEE
AR max vel: 2.26 cm2
AV Area VTI: 1.86 cm2
AV Area mean vel: 1.8 cm2
AV Mean grad: 29 mmHg
AV Peak grad: 50.1 mmHg
Ao pk vel: 3.54 m/s
Area-P 1/2: 2.97 cm2
Height: 72 in
MV M vel: 4.45 m/s
MV Peak grad: 79.2 mmHg
Weight: 3200 oz

## 2020-07-09 LAB — POCT I-STAT EG7
Acid-Base Excess: 1 mmol/L (ref 0.0–2.0)
Bicarbonate: 26.7 mmol/L (ref 20.0–28.0)
Calcium, Ion: 1.04 mmol/L — ABNORMAL LOW (ref 1.15–1.40)
HCT: 28 % — ABNORMAL LOW (ref 39.0–52.0)
Hemoglobin: 9.5 g/dL — ABNORMAL LOW (ref 13.0–17.0)
O2 Saturation: 78 %
Potassium: 4.5 mmol/L (ref 3.5–5.1)
Sodium: 140 mmol/L (ref 135–145)
TCO2: 28 mmol/L (ref 22–32)
pCO2, Ven: 47 mmHg (ref 44.0–60.0)
pH, Ven: 7.363 (ref 7.250–7.430)
pO2, Ven: 44 mmHg (ref 32.0–45.0)

## 2020-07-09 LAB — APTT: aPTT: 35 seconds (ref 24–36)

## 2020-07-09 LAB — PROTIME-INR
INR: 1.3 — ABNORMAL HIGH (ref 0.8–1.2)
Prothrombin Time: 16.4 seconds — ABNORMAL HIGH (ref 11.4–15.2)

## 2020-07-09 LAB — PLATELET COUNT: Platelets: 136 10*3/uL — ABNORMAL LOW (ref 150–400)

## 2020-07-09 LAB — HEMOGLOBIN AND HEMATOCRIT, BLOOD
HCT: 29 % — ABNORMAL LOW (ref 39.0–52.0)
Hemoglobin: 9.4 g/dL — ABNORMAL LOW (ref 13.0–17.0)

## 2020-07-09 LAB — ABO/RH: ABO/RH(D): B POS

## 2020-07-09 SURGERY — REPLACEMENT, AORTIC VALVE, OPEN
Anesthesia: General | Site: Chest

## 2020-07-09 MED ORDER — METOPROLOL TARTRATE 25 MG/10 ML ORAL SUSPENSION: 12.5000 mg | Freq: Two times a day (BID) | ORAL | Status: DC

## 2020-07-09 MED ORDER — SODIUM CHLORIDE 0.45 % IV SOLN: INTRAVENOUS | Status: DC | PRN

## 2020-07-09 MED ORDER — LACTATED RINGERS IV SOLN: 500.0000 mL | Freq: Once | INTRAVENOUS | Status: DC | PRN

## 2020-07-09 MED ORDER — CEFAZOLIN SODIUM-DEXTROSE 2-4 GM/100ML-% IV SOLN
2.0000 g | Freq: Three times a day (TID) | INTRAVENOUS | Status: DC
  Administered 2020-07-09 – 2020-07-10 (×2): 2 g via INTRAVENOUS
  Filled 2020-07-09: qty 100

## 2020-07-09 MED ORDER — PHENYLEPHRINE 40 MCG/ML (10ML) SYRINGE FOR IV PUSH (FOR BLOOD PRESSURE SUPPORT)
PREFILLED_SYRINGE | INTRAVENOUS | Status: DC | PRN
  Administered 2020-07-09: 80 ug via INTRAVENOUS
  Administered 2020-07-09: 120 ug via INTRAVENOUS
  Administered 2020-07-09: 80 ug via INTRAVENOUS
  Administered 2020-07-09: 200 ug via INTRAVENOUS
  Administered 2020-07-09: 80 ug via INTRAVENOUS
  Administered 2020-07-09: 120 ug via INTRAVENOUS

## 2020-07-09 MED ORDER — DEXTROSE 50 % IV SOLN: 0.0000 mL | INTRAVENOUS | Status: DC | PRN

## 2020-07-09 MED ORDER — ACETAMINOPHEN 500 MG PO TABS
1000.0000 mg | ORAL_TABLET | Freq: Four times a day (QID) | ORAL | Status: AC
  Administered 2020-07-10 – 2020-07-14 (×12): 1000 mg via ORAL
  Filled 2020-07-09 (×13): qty 2

## 2020-07-09 MED ORDER — ROCURONIUM BROMIDE 10 MG/ML (PF) SYRINGE
PREFILLED_SYRINGE | INTRAVENOUS | Status: DC | PRN
  Administered 2020-07-09: 40 mg via INTRAVENOUS
  Administered 2020-07-09: 50 mg via INTRAVENOUS
  Administered 2020-07-09: 60 mg via INTRAVENOUS

## 2020-07-09 MED ORDER — PROTAMINE SULFATE 10 MG/ML IV SOLN
INTRAVENOUS | Status: AC
  Filled 2020-07-09: qty 25

## 2020-07-09 MED ORDER — LACTATED RINGERS IV SOLN: INTRAVENOUS | Status: DC

## 2020-07-09 MED ORDER — FENTANYL CITRATE (PF) 250 MCG/5ML IJ SOLN
INTRAMUSCULAR | Status: DC | PRN
  Administered 2020-07-09 (×2): 150 ug via INTRAVENOUS
  Administered 2020-07-09: 50 ug via INTRAVENOUS
  Administered 2020-07-09: 100 ug via INTRAVENOUS
  Administered 2020-07-09 (×3): 150 ug via INTRAVENOUS
  Administered 2020-07-09: 100 ug via INTRAVENOUS

## 2020-07-09 MED ORDER — DULOXETINE HCL 60 MG PO CPEP
120.0000 mg | ORAL_CAPSULE | Freq: Every day | ORAL | Status: DC
  Administered 2020-07-10 – 2020-07-17 (×8): 120 mg via ORAL
  Filled 2020-07-09 (×8): qty 2

## 2020-07-09 MED ORDER — MAGNESIUM SULFATE 4 GM/100ML IV SOLN
4.0000 g | Freq: Once | INTRAVENOUS | Status: AC
  Administered 2020-07-09: 4 g via INTRAVENOUS
  Filled 2020-07-09: qty 100

## 2020-07-09 MED ORDER — BISACODYL 5 MG PO TBEC
10.0000 mg | DELAYED_RELEASE_TABLET | Freq: Every day | ORAL | Status: DC
  Administered 2020-07-10 – 2020-07-11 (×2): 10 mg via ORAL
  Filled 2020-07-09 (×2): qty 2

## 2020-07-09 MED ORDER — METOPROLOL TARTRATE 12.5 MG HALF TABLET
12.5000 mg | ORAL_TABLET | Freq: Once | ORAL | Status: DC
Start: 2020-07-09 — End: 2020-07-09

## 2020-07-09 MED ORDER — VANCOMYCIN HCL IN DEXTROSE 1-5 GM/200ML-% IV SOLN
1000.0000 mg | Freq: Once | INTRAVENOUS | Status: AC
  Administered 2020-07-09: 1000 mg via INTRAVENOUS
  Filled 2020-07-09: qty 200

## 2020-07-09 MED ORDER — ACETAMINOPHEN 160 MG/5ML PO SOLN
1000.0000 mg | Freq: Four times a day (QID) | ORAL | Status: AC
  Filled 2020-07-09: qty 40.6

## 2020-07-09 MED ORDER — LIDOCAINE HCL (PF) 2 % IJ SOLN
INTRAMUSCULAR | Status: DC | PRN
  Administered 2020-07-09: 80 mg via INTRADERMAL

## 2020-07-09 MED ORDER — FAMOTIDINE IN NACL 20-0.9 MG/50ML-% IV SOLN
20.0000 mg | Freq: Two times a day (BID) | INTRAVENOUS | Status: AC
  Administered 2020-07-09: 20 mg via INTRAVENOUS
  Filled 2020-07-09 (×2): qty 50

## 2020-07-09 MED ORDER — CHLORHEXIDINE GLUCONATE 0.12 % MT SOLN
OROMUCOSAL | Status: AC
  Filled 2020-07-09: qty 15

## 2020-07-09 MED ORDER — ~~LOC~~ CARDIAC SURGERY, PATIENT & FAMILY EDUCATION
Freq: Once | Status: DC
  Filled 2020-07-09: qty 1

## 2020-07-09 MED ORDER — HEPARIN SODIUM (PORCINE) 1000 UNIT/ML IJ SOLN
INTRAMUSCULAR | Status: AC
  Filled 2020-07-09: qty 1

## 2020-07-09 MED ORDER — ASPIRIN EC 325 MG PO TBEC
325.0000 mg | DELAYED_RELEASE_TABLET | Freq: Every day | ORAL | Status: DC
  Administered 2020-07-10 – 2020-07-13 (×4): 325 mg via ORAL
  Filled 2020-07-09 (×4): qty 1

## 2020-07-09 MED ORDER — BISACODYL 10 MG RE SUPP: 10.0000 mg | Freq: Every day | RECTAL | Status: DC

## 2020-07-09 MED ORDER — ONDANSETRON HCL 4 MG/2ML IJ SOLN: 4.0000 mg | Freq: Four times a day (QID) | INTRAMUSCULAR | Status: DC | PRN

## 2020-07-09 MED ORDER — MIDAZOLAM HCL (PF) 10 MG/2ML IJ SOLN
INTRAMUSCULAR | Status: AC
  Filled 2020-07-09: qty 2

## 2020-07-09 MED ORDER — PLASMA-LYTE A IV SOLN
INTRAVENOUS | Status: DC | PRN
  Administered 2020-07-09: 1000 mL via INTRAVASCULAR

## 2020-07-09 MED ORDER — CHLORHEXIDINE GLUCONATE 4 % EX LIQD: 30.0000 mL | CUTANEOUS | Status: DC

## 2020-07-09 MED ORDER — ROCURONIUM BROMIDE 10 MG/ML (PF) SYRINGE
PREFILLED_SYRINGE | INTRAVENOUS | Status: AC
  Filled 2020-07-09: qty 10

## 2020-07-09 MED ORDER — DOCUSATE SODIUM 100 MG PO CAPS
200.0000 mg | ORAL_CAPSULE | Freq: Every day | ORAL | Status: DC
  Administered 2020-07-10: 200 mg via ORAL
  Filled 2020-07-09: qty 2

## 2020-07-09 MED ORDER — HEMOSTATIC AGENTS (NO CHARGE) OPTIME
TOPICAL | Status: DC | PRN
  Administered 2020-07-09: 1 via TOPICAL

## 2020-07-09 MED ORDER — SODIUM CHLORIDE 0.9 % IV SOLN: INTRAVENOUS | Status: DC

## 2020-07-09 MED ORDER — POTASSIUM CHLORIDE 10 MEQ/50ML IV SOLN
10.0000 meq | INTRAVENOUS | Status: AC
Start: 2020-07-09 — End: 2020-07-09

## 2020-07-09 MED ORDER — PHENYLEPHRINE HCL-NACL 20-0.9 MG/250ML-% IV SOLN
0.0000 ug/min | INTRAVENOUS | Status: DC
  Administered 2020-07-09: 30 ug/min via INTRAVENOUS
  Filled 2020-07-09: qty 250

## 2020-07-09 MED ORDER — CHLORHEXIDINE GLUCONATE 0.12 % MT SOLN
15.0000 mL | OROMUCOSAL | Status: AC
  Administered 2020-07-09: 15 mL via OROMUCOSAL

## 2020-07-09 MED ORDER — HEPARIN SODIUM (PORCINE) 1000 UNIT/ML IJ SOLN
INTRAMUSCULAR | Status: DC | PRN
  Administered 2020-07-09: 32000 [IU] via INTRAVENOUS

## 2020-07-09 MED ORDER — ARTIFICIAL TEARS OPHTHALMIC OINT
TOPICAL_OINTMENT | OPHTHALMIC | Status: AC
  Filled 2020-07-09: qty 3.5

## 2020-07-09 MED ORDER — PROTAMINE SULFATE 10 MG/ML IV SOLN
INTRAVENOUS | Status: AC
  Filled 2020-07-09: qty 5

## 2020-07-09 MED ORDER — PROPOFOL 10 MG/ML IV BOLUS
INTRAVENOUS | Status: DC | PRN
  Administered 2020-07-09: 120 mg via INTRAVENOUS

## 2020-07-09 MED ORDER — SODIUM CHLORIDE 0.9% FLUSH: 3.0000 mL | INTRAVENOUS | Status: DC | PRN

## 2020-07-09 MED ORDER — PANTOPRAZOLE SODIUM 40 MG PO TBEC
40.0000 mg | DELAYED_RELEASE_TABLET | Freq: Every day | ORAL | Status: DC
  Administered 2020-07-12 – 2020-07-13 (×2): 40 mg via ORAL
  Filled 2020-07-09 (×2): qty 1

## 2020-07-09 MED ORDER — LACTATED RINGERS IV SOLN: INTRAVENOUS | Status: DC | PRN

## 2020-07-09 MED ORDER — THROMBIN 20000 UNITS EX SOLR
OROMUCOSAL | Status: DC | PRN
  Administered 2020-07-09 (×3): 4 mL via TOPICAL

## 2020-07-09 MED ORDER — LAMOTRIGINE 25 MG PO TABS
75.0000 mg | ORAL_TABLET | Freq: Every day | ORAL | Status: DC
  Administered 2020-07-10 – 2020-07-17 (×8): 75 mg via ORAL
  Filled 2020-07-09 (×8): qty 3

## 2020-07-09 MED ORDER — CHLORHEXIDINE GLUCONATE CLOTH 2 % EX PADS
6.0000 | MEDICATED_PAD | Freq: Every day | CUTANEOUS | Status: DC
  Administered 2020-07-11 – 2020-07-13 (×3): 6 via TOPICAL

## 2020-07-09 MED ORDER — SODIUM BICARBONATE 8.4 % IV SOLN
50.0000 meq | Freq: Once | INTRAVENOUS | Status: AC
  Administered 2020-07-09: 50 meq via INTRAVENOUS

## 2020-07-09 MED ORDER — CHLORHEXIDINE GLUCONATE 0.12 % MT SOLN
15.0000 mL | Freq: Once | OROMUCOSAL | Status: AC
  Administered 2020-07-09: 15 mL via OROMUCOSAL

## 2020-07-09 MED ORDER — PROTAMINE SULFATE 10 MG/ML IV SOLN
INTRAVENOUS | Status: DC | PRN
  Administered 2020-07-09: 250 mg via INTRAVENOUS
  Administered 2020-07-09: 30 mg via INTRAVENOUS

## 2020-07-09 MED ORDER — OXYCODONE HCL 5 MG PO TABS
5.0000 mg | ORAL_TABLET | ORAL | Status: DC | PRN
  Administered 2020-07-09 – 2020-07-11 (×2): 10 mg via ORAL
  Administered 2020-07-13 (×2): 5 mg via ORAL
  Administered 2020-07-14: 10 mg via ORAL
  Filled 2020-07-09: qty 1
  Filled 2020-07-09 (×2): qty 2
  Filled 2020-07-09: qty 1
  Filled 2020-07-09: qty 2

## 2020-07-09 MED ORDER — THROMBIN 20000 UNITS EX SOLR
CUTANEOUS | Status: DC | PRN
  Administered 2020-07-09: 20000 [IU] via TOPICAL

## 2020-07-09 MED ORDER — METOPROLOL TARTRATE 12.5 MG HALF TABLET: 12.5000 mg | ORAL_TABLET | Freq: Two times a day (BID) | ORAL | Status: DC

## 2020-07-09 MED ORDER — NITROGLYCERIN IN D5W 200-5 MCG/ML-% IV SOLN: 0.0000 ug/min | INTRAVENOUS | Status: DC

## 2020-07-09 MED ORDER — SODIUM CHLORIDE 0.9% FLUSH
3.0000 mL | Freq: Two times a day (BID) | INTRAVENOUS | Status: DC
  Administered 2020-07-10 – 2020-07-13 (×8): 3 mL via INTRAVENOUS

## 2020-07-09 MED ORDER — ACETAMINOPHEN 650 MG RE SUPP
650.0000 mg | Freq: Once | RECTAL | Status: AC
  Administered 2020-07-09: 650 mg via RECTAL

## 2020-07-09 MED ORDER — CEFAZOLIN SODIUM-DEXTROSE 2-4 GM/100ML-% IV SOLN
2.0000 g | Freq: Three times a day (TID) | INTRAVENOUS | Status: DC
  Filled 2020-07-09 (×2): qty 100

## 2020-07-09 MED ORDER — NOREPINEPHRINE 16 MG/250ML-% IV SOLN
0.0000 ug/min | INTRAVENOUS | Status: DC
  Administered 2020-07-09: 2 ug/min via INTRAVENOUS
  Administered 2020-07-10: 3 ug/min via INTRAVENOUS
  Filled 2020-07-09 (×2): qty 250

## 2020-07-09 MED ORDER — METOPROLOL TARTRATE 5 MG/5ML IV SOLN
2.5000 mg | INTRAVENOUS | Status: DC | PRN
  Administered 2020-07-11 (×3): 5 mg via INTRAVENOUS
  Filled 2020-07-09 (×3): qty 5

## 2020-07-09 MED ORDER — FENTANYL CITRATE (PF) 100 MCG/2ML IJ SOLN
25.0000 ug | INTRAMUSCULAR | Status: DC | PRN
  Administered 2020-07-09: 50 ug via INTRAVENOUS
  Administered 2020-07-09: 25 ug via INTRAVENOUS
  Administered 2020-07-10 – 2020-07-11 (×9): 50 ug via INTRAVENOUS
  Filled 2020-07-09 (×11): qty 2

## 2020-07-09 MED ORDER — TRAZODONE HCL 150 MG PO TABS
150.0000 mg | ORAL_TABLET | Freq: Every day | ORAL | Status: DC
  Administered 2020-07-11 – 2020-07-16 (×6): 150 mg via ORAL
  Filled 2020-07-09 (×7): qty 1

## 2020-07-09 MED ORDER — DEXMEDETOMIDINE HCL IN NACL 400 MCG/100ML IV SOLN
0.0000 ug/kg/h | INTRAVENOUS | Status: DC
  Administered 2020-07-09 – 2020-07-10 (×2): 0.5 ug/kg/h via INTRAVENOUS
  Filled 2020-07-09: qty 100
  Filled 2020-07-09: qty 200

## 2020-07-09 MED ORDER — MIDAZOLAM HCL (PF) 5 MG/ML IJ SOLN
INTRAMUSCULAR | Status: DC | PRN
  Administered 2020-07-09: 2 mg via INTRAVENOUS
  Administered 2020-07-09: 1 mg via INTRAVENOUS
  Administered 2020-07-09: 2 mg via INTRAVENOUS
  Administered 2020-07-09: 1 mg via INTRAVENOUS

## 2020-07-09 MED ORDER — SODIUM CHLORIDE 0.9 % IV SOLN: 250.0000 mL | INTRAVENOUS | Status: DC

## 2020-07-09 MED ORDER — MIDAZOLAM HCL 2 MG/2ML IJ SOLN: 2.0000 mg | INTRAMUSCULAR | Status: DC | PRN

## 2020-07-09 MED ORDER — INSULIN REGULAR(HUMAN) IN NACL 100-0.9 UT/100ML-% IV SOLN: INTRAVENOUS | Status: DC

## 2020-07-09 MED ORDER — FENTANYL CITRATE (PF) 250 MCG/5ML IJ SOLN
INTRAMUSCULAR | Status: AC
  Filled 2020-07-09: qty 20

## 2020-07-09 MED ORDER — ACETAMINOPHEN 160 MG/5ML PO SOLN: 650.0000 mg | Freq: Once | ORAL | Status: AC

## 2020-07-09 MED ORDER — SODIUM CHLORIDE 0.9% IV SOLUTION
Freq: Once | INTRAVENOUS | Status: AC
Start: 2020-07-09 — End: 2020-07-09

## 2020-07-09 MED ORDER — ASPIRIN 81 MG PO CHEW: 324.0000 mg | CHEWABLE_TABLET | Freq: Every day | ORAL | Status: DC

## 2020-07-09 MED ORDER — ALBUMIN HUMAN 5 % IV SOLN
250.0000 mL | INTRAVENOUS | Status: AC | PRN
  Administered 2020-07-09 – 2020-07-10 (×3): 12.5 g via INTRAVENOUS
  Filled 2020-07-09: qty 250

## 2020-07-09 MED ORDER — CEFAZOLIN SODIUM-DEXTROSE 2-4 GM/100ML-% IV SOLN: 2.0000 g | Freq: Three times a day (TID) | INTRAVENOUS | Status: DC

## 2020-07-09 MED ORDER — PROPOFOL 10 MG/ML IV BOLUS
INTRAVENOUS | Status: AC
  Filled 2020-07-09: qty 20

## 2020-07-09 MED ORDER — ALBUMIN HUMAN 5 % IV SOLN: INTRAVENOUS | Status: DC | PRN

## 2020-07-09 MED ORDER — THROMBIN (RECOMBINANT) 20000 UNITS EX SOLR
CUTANEOUS | Status: AC
  Filled 2020-07-09: qty 20000

## 2020-07-09 SURGICAL SUPPLY — 86 items
ADAPTER CARDIO PERF ANTE/RETRO (ADAPTER) ×3 IMPLANT
ADAPTER MULTI PERFUSION 15 (ADAPTER) ×3 IMPLANT
BAG DECANTER FOR FLEXI CONT (MISCELLANEOUS) ×3 IMPLANT
BLADE CLIPPER SURG (BLADE) ×3 IMPLANT
BLADE STERNUM SYSTEM 6 (BLADE) ×3 IMPLANT
BLADE SURG 15 STRL LF DISP TIS (BLADE) ×2 IMPLANT
BLADE SURG 15 STRL SS (BLADE) ×1
CANISTER SUCT 3000ML PPV (MISCELLANEOUS) ×3 IMPLANT
CANN PRFSN 3/8X14X24FR PCFC (MISCELLANEOUS) ×2
CANNULA AORTIC ROOT 9FR (CANNULA) ×3 IMPLANT
CANNULA ARTERIAL VENT 3/8 20FR (CANNULA) ×3 IMPLANT
CANNULA GUNDRY RCSP 15FR (MISCELLANEOUS) ×3 IMPLANT
CANNULA PRFSN 3/8X14X24FR PCFC (MISCELLANEOUS) ×2 IMPLANT
CANNULA SUMP PERICARDIAL (CANNULA) ×3 IMPLANT
CANNULA VEN MTL TIP RT (MISCELLANEOUS) ×1
CANNULA VRC MALB SNGL STG 34FR (MISCELLANEOUS) ×2 IMPLANT
CATH HEART VENT LEFT (CATHETERS) ×2 IMPLANT
CATH ROBINSON RED A/P 18FR (CATHETERS) ×12 IMPLANT
CATH THORACIC 36FR (CATHETERS) ×3 IMPLANT
CATH THORACIC 36FR RT ANG (CATHETERS) ×3 IMPLANT
CNTNR URN SCR LID CUP LEK RST (MISCELLANEOUS) ×4 IMPLANT
CONN 1/2X1/2X1/2  BEN (MISCELLANEOUS) ×1
CONN 1/2X1/2X1/2 BEN (MISCELLANEOUS) ×2 IMPLANT
CONN 3/8X1/2 ST GISH (MISCELLANEOUS) ×6 IMPLANT
CONT SPEC 4OZ STRL OR WHT (MISCELLANEOUS) ×2
CONTAINER PROTECT SURGISLUSH (MISCELLANEOUS) ×6 IMPLANT
COVER SURGICAL LIGHT HANDLE (MISCELLANEOUS) ×6 IMPLANT
DEVICE SUT CK QUICK LOAD MINI (Prosthesis & Implant Heart) ×3 IMPLANT
DRAPE CARDIOVASCULAR INCISE (DRAPES) ×1
DRAPE SRG 135X102X78XABS (DRAPES) ×2 IMPLANT
DRAPE WARM FLUID 44X44 (DRAPES) ×3 IMPLANT
DRSG COVADERM 4X14 (GAUZE/BANDAGES/DRESSINGS) ×3 IMPLANT
ELECT CAUTERY BLADE 6.4 (BLADE) ×3 IMPLANT
ELECT REM PT RETURN 9FT ADLT (ELECTROSURGICAL) ×6
ELECTRODE REM PT RTRN 9FT ADLT (ELECTROSURGICAL) ×4 IMPLANT
FELT TEFLON 1X6 (MISCELLANEOUS) ×6 IMPLANT
GAUZE SPONGE 4X4 12PLY STRL (GAUZE/BANDAGES/DRESSINGS) ×3 IMPLANT
GLOVE BIO SURGEON STRL SZ 6.5 (GLOVE) IMPLANT
GLOVE SURG MICRO LTX SZ7 (GLOVE) ×6 IMPLANT
GOWN STRL REUS W/ TWL LRG LVL3 (GOWN DISPOSABLE) ×16 IMPLANT
GOWN STRL REUS W/ TWL XL LVL3 (GOWN DISPOSABLE) ×2 IMPLANT
GOWN STRL REUS W/TWL LRG LVL3 (GOWN DISPOSABLE) ×8
GOWN STRL REUS W/TWL XL LVL3 (GOWN DISPOSABLE) ×1
HEMOSTAT POWDER SURGIFOAM 1G (HEMOSTASIS) ×9 IMPLANT
HEMOSTAT SURGICEL 2X14 (HEMOSTASIS) ×3 IMPLANT
INSERT FOGARTY XLG (MISCELLANEOUS) ×3 IMPLANT
KIT BASIN OR (CUSTOM PROCEDURE TRAY) ×3 IMPLANT
KIT SUCTION CATH 14FR (SUCTIONS) ×3 IMPLANT
KIT SUT CK MINI COMBO 4X17 (Prosthesis & Implant Heart) ×3 IMPLANT
KIT TURNOVER KIT B (KITS) ×3 IMPLANT
LOOP VESSEL SUPERMAXI WHITE (MISCELLANEOUS) ×3 IMPLANT
NS IRRIG 1000ML POUR BTL (IV SOLUTION) ×18 IMPLANT
PACK E OPEN HEART (SUTURE) ×3 IMPLANT
PACK OPEN HEART (CUSTOM PROCEDURE TRAY) ×3 IMPLANT
PAD ARMBOARD 7.5X6 YLW CONV (MISCELLANEOUS) ×6 IMPLANT
POSITIONER HEAD DONUT 9IN (MISCELLANEOUS) ×3 IMPLANT
SET MPS 3-ND DEL (MISCELLANEOUS) ×3 IMPLANT
SUT BONE WAX W31G (SUTURE) ×3 IMPLANT
SUT EB EXC GRN/WHT 2-0 V-5 (SUTURE) ×3 IMPLANT
SUT ETHIBON EXCEL 2-0 V-5 (SUTURE) ×6 IMPLANT
SUT ETHIBOND 2 0 SH (SUTURE) ×1 IMPLANT
SUT ETHIBOND 2 0 SH 36X2 (SUTURE) ×2 IMPLANT
SUT ETHIBOND 2 0 V4 (SUTURE) IMPLANT
SUT ETHIBOND 2 0V4 GREEN (SUTURE) IMPLANT
SUT ETHIBOND V-5 VALVE (SUTURE) ×6 IMPLANT
SUT PROLENE 3 0 SH 48 (SUTURE) ×12 IMPLANT
SUT PROLENE 3 0 SH DA (SUTURE) IMPLANT
SUT PROLENE 3 0 SH1 36 (SUTURE) ×3 IMPLANT
SUT PROLENE 4 0 RB 1 (SUTURE) ×5
SUT PROLENE 4-0 RB1 .5 CRCL 36 (SUTURE) ×10 IMPLANT
SUT SILK 2 0 SH CR/8 (SUTURE) ×3 IMPLANT
SUT STEEL 6MS V (SUTURE) IMPLANT
SUT VIC AB 1 CTX 36 (SUTURE) ×4
SUT VIC AB 1 CTX36XBRD ANBCTR (SUTURE) ×8 IMPLANT
SYSTEM SAHARA CHEST DRAIN ATS (WOUND CARE) ×3 IMPLANT
TAPE CLOTH SURG 4X10 WHT LF (GAUZE/BANDAGES/DRESSINGS) ×3 IMPLANT
TAPE PAPER 2X10 WHT MICROPORE (GAUZE/BANDAGES/DRESSINGS) ×3 IMPLANT
TOWEL GREEN STERILE (TOWEL DISPOSABLE) ×3 IMPLANT
TOWEL GREEN STERILE FF (TOWEL DISPOSABLE) ×3 IMPLANT
TRAY FOLEY SLVR 16FR TEMP STAT (SET/KITS/TRAYS/PACK) ×3 IMPLANT
TUBE SUCT INTRACARD DLP 20F (MISCELLANEOUS) ×3 IMPLANT
UNDERPAD 30X36 HEAVY ABSORB (UNDERPADS AND DIAPERS) ×3 IMPLANT
VALVE AORTIC SZ25 INSP/RESIL (Prosthesis & Implant Heart) ×3 IMPLANT
VENT LEFT HEART 12002 (CATHETERS) ×3
VRC MALLEABLE SINGLE STG 34FR (MISCELLANEOUS) ×3
WATER STERILE IRR 1000ML POUR (IV SOLUTION) ×6 IMPLANT

## 2020-07-09 NOTE — Op Note (Signed)
CARDIOVASCULAR SURGERY OPERATIVE NOTE  07/09/2020 Charles [name] [id number]  Surgeon:  Charles Borne, MD  First Assistant: Charles Crane,  PA-C   Preoperative Diagnosis:  Severe aortic stenosis   Postoperative Diagnosis:  Same   Procedure:  Median Sternotomy Extracorporeal circulation 3.   Aortic valve replacement using a 25 mm Edwards INSPIRIS RESILIA pericardial valve. 4    Septal myectomy  Anesthesia:  General Endotracheal   Clinical History/Surgical Indication:  This 75 year old gentleman has stage D, moderate to severe, symptomatic aortic stenosis with New York Heart Association class II-[name] symptoms of exertional fatigue and shortness of breath consistent with chronic diastolic congestive heart failure.  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 It is felt that the best treatment is to perform AVR with septal myectomy and hopefully this will resolve his SAM and MR. It is still possible that he may require MV repair or replacement but I think that is unlikely. I discussed the operative procedure with the patient and his son including alternatives, benefits and risks; including but not limited to bleeding, blood transfusion, infection, stroke, myocardial infarction, graft failure, heart block requiring a permanent pacemaker, organ dysfunction, and death.  Charles [name] understands and agrees to proceed.      Preparation:  The patient was seen in the preoperative holding area and the correct patient, correct operation were confirmed with the patient after reviewing the medical record and catheterization. The consent was signed by me. Preoperative antibiotics were given. A pulmonary arterial line and radial arterial line were placed by the anesthesia team. The patient was taken back to the operating room and positioned supine on the operating room table.  After being placed under general endotracheal anesthesia by the anesthesia team a foley catheter was placed. The neck, chest, abdomen, and both legs were prepped with betadine soap and solution and draped in the usual sterile manner. A surgical time-out was taken and the correct patient and operative procedure were confirmed with the nursing and anesthesia staff.     Cardiopulmonary Bypass:  A median sternotomy was performed. The pericardium was opened in the midline. Right ventricular function appeared normal. The ascending aorta was of normal size and had no palpable plaque. There were no contraindications to aortic cannulation or cross-clamping. The patient was fully systemically heparinized and the ACT was maintained > 400 sec. The proximal aortic arch was cannulated with a 20 F aortic cannula for arterial inflow. Bi-caval venous cannulation was performed via the SVC using a 24 F metal tip right angle cannula and the IVC using a 34 F plastic malleable cannula.  An antegrade cardioplegia/vent cannula was inserted into the mid-ascending aorta. A left ventricular vent was placed via the right superior pulmonary vein. A retrograde cardioplegia cannnula was placed into the coronary sinus via the right atrium. Aortic occlusion was performed with a single cross-clamp. Systemic cooling to 32 degrees Centigrade and topical cooling of the heart with iced saline were used. Antegrade KBC cardioplegia was used to induce diastolic arrest and then then given again after 80 minutes to maintain myocardial temperature at or below 10 degrees centigrade. A temperature probe was inserted into the interventricular septum and an insulating pad was placed in the pericardium. Carbon dioxide was insufflated into the pericardium at 5L/min throughout the procedure to minimize intracardiac air.   Aortic Valve Replacement:   A transverse aortotomy was performed 1 cm above  the take-off of the right coronary artery. The native  valve was trileaflet with moderately calcified leaflets and moderate annular calcification. The ostia of the coronary arteries were in normal position and were not obstructed. The native valve leaflets were excised and the annulus was decalcified with rongeurs. Care was taken to remove all particulate debris. The left ventricle was directly inspected for debris and then irrigated with ice saline solution. The annulus was sized and a size 25 mm Edwards INSPIRIS RESILIA pericardial valve was chosen. The model number was [id number] and the serial number was [id number]. While the valve was being prepared 2-0 Ethibond pledgeted horizontal mattress sutures were placed around the annulus with the pledgets in a sub-annular position. The sutures were placed through the sewing ring and the valve lowered into place. The sutures were tied using the CorKnot device.  The valve seated nicely and the coronary ostia were not obstructed. The prosthetic valve leaflets moved normally and there was no sub-valvular obstruction. The aortotomy was closed using 4-0 Prolene suture in 2 layers with felt strips to reinforce the closure.   Septal myectomy:  Before the valve sutures were placed a 1 cm deep incision was made vertically in the septum beneath the left/right commissure starting 1 cm below the annulus. A second 1 cm deep vertical incision was made in the septum beneath the nadir of the right coronary annulus. A third incision was made horizontally to join the two vertical incisions creating a channel in the LVOT. The right side of the heart was filled with blood and there was no VSD.      Completion:  The patient was rewarmed to 37 degrees Centigrade. De-airing maneuvers were performed and the head placed in trendelenburg position. The crossclamp was removed with a time of 108 minutes. There was spontaneous return of sinus rhythm. The aortotomy was checked for hemostasis. Two temporary epicardial pacing wires were placed on the  right atrium and two on the right ventricle. The left ventricular vent and retrograde cardioplegia cannulas were removed. The patient was weaned from CPB without difficulty on no inotropes. CPB time was 137 minutes. Cardiac output was 5 LPM. TEE showed normal LV systolic function. The aortic valve prosthesis was functioning normally with no paravalvular leak. There was still some SAM but it looked better and the MR was mild. I felt that this was acceptable and as good as it was going to get considering the length of the anterior mitral leaflet. Heparin was fully reversed with protamine and the aortic and venous cannulas removed. Hemostasis was achieved. Mediastinal drainage tubes were placed. The sternum was closed with double #6 stainless steel wires. The fascia was closed with continuous # 1 vicryl suture. The subcutaneous tissue was closed with 2-0 vicryl continuous suture. The skin was closed with 3-0 vicryl subcuticular suture. All sponge, needle, and instrument counts were reported correct at the end of the case. Dry sterile dressings were placed over the incisions and around the chest tubes which were connected to pleurevac suction. The patient was then transported to the surgical intensive care unit in stable condition.

## 2020-07-09 NOTE — Anesthesia Procedure Notes (Signed)
Central Venous Catheter Insertion Performed by: Atilano Median, DO, anesthesiologist Start/End6/20/2022 7:05 AM, 07/09/2020 7:07 AM Patient location: Pre-op. Preanesthetic checklist: patient identified, IV checked, site marked, risks and benefits discussed, surgical consent, monitors and equipment checked, pre-op evaluation, timeout performed and anesthesia consent Position: supine Hand hygiene performed  and maximum sterile barriers used  PA cath was placed.Swan type:thermodilution PA Cath depth:50 Procedure performed without using ultrasound guided technique. Attempts: 1 Patient tolerated the procedure well with no immediate complications.

## 2020-07-09 NOTE — Anesthesia Procedure Notes (Addendum)
Arterial Line Insertion Start/End6/20/2022 6:45 AM, 07/09/2020 6:50 AM Performed by: Epifanio Lesches, CRNA, CRNA  Patient location: Pre-op. Preanesthetic checklist: patient identified, IV checked, site marked and risks and benefits discussed Lidocaine 1% used for infiltration and patient sedated Left, radial was placed Catheter size: 20 G Hand hygiene performed  and maximum sterile barriers used  Allen's test indicative of satisfactory collateral circulation Attempts: 1 Procedure performed without using ultrasound guided technique. Following insertion, dressing applied and Biopatch. Post procedure assessment: normal  Patient tolerated the procedure well with no immediate complications.

## 2020-07-09 NOTE — Anesthesia Procedure Notes (Signed)
Procedure Name: Intubation Date/Time: 07/09/2020 7:48 AM Performed by: Barrington Ellison, CRNA Pre-anesthesia Checklist: Patient identified, Emergency Drugs available, Suction available and Patient being monitored Patient Re-evaluated:Patient Re-evaluated prior to induction Oxygen Delivery Method: Circle System Utilized Preoxygenation: Pre-oxygenation with 100% oxygen Induction Type: IV induction Ventilation: Mask ventilation without difficulty Laryngoscope Size: Mac and 4 Grade View: Grade I Tube type: Oral Tube size: 8.0 mm Number of attempts: 1 Airway Equipment and Method: Stylet and Oral airway Placement Confirmation: ETT inserted through vocal cords under direct vision, positive ETCO2 and breath sounds checked- equal and bilateral Secured at: 24 cm Tube secured with: Tape Dental Injury: Teeth and Oropharynx as per pre-operative assessment

## 2020-07-09 NOTE — Interval H&P Note (Signed)
History and Physical Interval Note:  07/09/2020 6:44 AM  Charles [name]  has presented today for surgery, with the diagnosis of SEVERE AS CHF MR.  The various methods of treatment have been discussed with the patient and family. After consideration of risks, benefits and other options for treatment, the patient has consented to  Procedure(s): AORTIC VALVE REPLACEMENT (AVR) AND SEPTAL MYOMECTOMY (N/A) possible MITRAL VALVE REPAIR OR REPLACEMENT (MVR) (N/A) TRANSESOPHAGEAL ECHOCARDIOGRAM (TEE) (N/A) as a surgical intervention.  The patient's history has been reviewed, patient examined, no change in status, stable for surgery.  I have reviewed the patient's chart and labs.  Questions were answered to the patient's satisfaction.     Charles [name]   

## 2020-07-09 NOTE — Transfer of Care (Signed)
Immediate Anesthesia Transfer of Care Note  Patient: DAAIEL STARLIN III  Procedure(s) Performed: AORTIC VALVE REPLACEMENT (AVR) USING INSPIRIS AORTIC VALVE AND SEPTAL MYOMECTOMY (Chest) TRANSESOPHAGEAL ECHOCARDIOGRAM (TEE)  Patient Location: ICU  Anesthesia Type:General  Level of Consciousness: Patient remains intubated per anesthesia plan  Airway & Oxygen Therapy: Patient remains intubated per anesthesia plan and Patient placed on Ventilator (see vital sign flow sheet for setting)  Post-op Assessment: Report given to RN  Post vital signs: Reviewed and stable  Last Vitals:  Vitals Value Taken Time  BP 87/56 07/09/20 1330  Temp    Pulse 59 07/09/20 1331  Resp 12 07/09/20 1331  SpO2 96 % 07/09/20 1331  Vitals shown include unvalidated device data.  Last Pain:  Vitals:   07/09/20 0615  TempSrc:   PainSc: 5       Patients Stated Pain Goal: 3 (07/09/20 0615)  Complications: No notable events documented.

## 2020-07-09 NOTE — Interval H&P Note (Signed)
History and Physical Interval Note:  07/09/2020 6:44 AM  Charles [name]  has presented today for surgery, with the diagnosis of SEVERE AS CHF MR.  The various methods of treatment have been discussed with the patient and family. After consideration of risks, benefits and other options for treatment, the patient has consented to  Procedure(s): AORTIC VALVE REPLACEMENT (AVR) AND SEPTAL MYOMECTOMY (N/A) possible MITRAL VALVE REPAIR OR REPLACEMENT (MVR) (N/A) TRANSESOPHAGEAL ECHOCARDIOGRAM (TEE) (N/A) as a surgical intervention.  The patient's history has been reviewed, patient examined, no change in status, stable for surgery.  I have reviewed the patient's chart and labs.  Questions were answered to the patient's satisfaction.     [name]

## 2020-07-09 NOTE — Progress Notes (Signed)
  Echocardiogram Echocardiogram Transesophageal has been performed.  Gerda Diss 07/09/2020, 8:48 AM

## 2020-07-09 NOTE — Anesthesia Procedure Notes (Signed)
Central Venous Catheter Insertion Performed by: Darral Dash, DO, anesthesiologist Start/End6/20/2022 6:54 AM, 07/09/2020 7:00 AM Patient location: Pre-op. Preanesthetic checklist: patient identified, IV checked, site marked, risks and benefits discussed, surgical consent, monitors and equipment checked, pre-op evaluation, timeout performed and anesthesia consent Position: Trendelenburg Lidocaine 1% used for infiltration and patient sedated Hand hygiene performed  and maximum sterile barriers used  Catheter size: 9 Fr Central line was placed.MAC introducer Procedure performed using ultrasound guided technique. Ultrasound Notes:anatomy identified, needle tip was noted to be adjacent to the nerve/plexus identified, no ultrasound evidence of intravascular and/or intraneural injection and image(s) printed for medical record Attempts: 1 Following insertion, line sutured, dressing applied and Biopatch. Post procedure assessment: blood return through all ports, free fluid flow and no air  Patient tolerated the procedure well with no immediate complications.

## 2020-07-09 NOTE — Brief Op Note (Signed)
07/09/2020  1:29 PM  PATIENT:  Charles [name]  75 y.o. male  PRE-OPERATIVE DIAGNOSIS:  SEVERE AORTIC STENOSIS CONGESTIVE HEART FAILURE MITRAL REGURGITATION  POST-OPERATIVE DIAGNOSIS:  SEVERE AORTIC STENOSIS CONGESTIVE HEART FAILURE MITRAL REGURGITATION  PROCEDURE:  Procedure(s): AORTIC VALVE REPLACEMENT (AVR) USING INSPIRIS AORTIC VALVE AND SEPTAL MYOMECTOMY (N/A) TRANSESOPHAGEAL ECHOCARDIOGRAM (TEE) (N/A)  SURGEON:  Surgeon(s) and Role:    * Bartle, Payton Doughty, MD - Primary  PHYSICIAN ASSISTANT: Cyleigh Massaro PA-C  ASSISTANTS: STAFF   ANESTHESIA:   general  EBL:  830 mL   BLOOD ADMINISTERED:none  DRAINS:  MEDIASTINAL CHEST DRAINS    LOCAL MEDICATIONS USED:  NONE  SPECIMEN:  Source of Specimen:  AORTIC VALVE LEAFLETS/SEPTAL MUSCLE SAMPLE  DISPOSITION OF SPECIMEN:  PATHOLOGY  COUNTS:  YES  TOURNIQUET:  * No tourniquets in log *  DICTATION: .Dragon Dictation  PLAN OF CARE: Admit to inpatient   PATIENT DISPOSITION:  ICU - intubated and hemodynamically stable.   Delay start of Pharmacological VTE agent (>24hrs) due to surgical blood loss or risk of bleeding: yes  COMPLICATIONS: NO KNOWN

## 2020-07-09 NOTE — Procedures (Signed)
Extubation Procedure Note  Patient Details:   Name: Charles [name] DOB: [birth year] MRN: [id number]   Airway Documentation:    Vent end date: 07/09/20 Vent end time: 2035   Evaluation  O2 sats: stable throughout Complications: No apparent complications Patient did tolerate procedure well. Bilateral Breath Sounds: Clear, Diminished   Yes  VC: 851 mL  NIF: -20 achieved  Positive Cuff Leak IS of 500 mL  extubated to 4L Charles [name]  Dellia Donnelly V 07/09/2020, 8:46 PM

## 2020-07-09 NOTE — Progress Notes (Signed)
Patient ID: Charles [name], male   DOB: [birth year], 75 y.o.   MRN: [id number]  TCTS Evening Rounds:   Hemodynamically stable  CI = 1.7 Internally paced at 60  Starting to wean on vent  Urine output good  CT output decreasing  CBC    Component Value Date/Time   WBC 9.6 07/09/2020 1846   RBC 2.78 (L) 07/09/2020 1846   HGB 8.3 (L) 07/09/2020 1846   HCT 26.0 (L) 07/09/2020 1846   PLT 163 07/09/2020 1846   MCV 93.5 07/09/2020 1846   MCH 29.9 07/09/2020 1846   MCHC 31.9 07/09/2020 1846   RDW 13.2 07/09/2020 1846     BMET    Component Value Date/Time   NA 140 07/09/2020 1601   K 4.9 07/09/2020 1601   CL 107 07/09/2020 1025   CO2 23 07/05/2020 1500   GLUCOSE 126 (H) 07/09/2020 1025   BUN 19 07/09/2020 1025   CREATININE 1.00 07/09/2020 1025   CALCIUM 9.7 07/05/2020 1500   GFRNONAA >60 07/05/2020 1500     A/P:  Stable postop course. Continue current plans

## 2020-07-10 ENCOUNTER — Encounter (HOSPITAL_COMMUNITY): Payer: Self-pay | Admitting: Surgery

## 2020-07-10 ENCOUNTER — Inpatient Hospital Stay (HOSPITAL_COMMUNITY): Payer: Medicare HMO

## 2020-07-10 LAB — POCT I-STAT 7, (LYTES, BLD GAS, ICA,H+H)
Acid-Base Excess: 2 mmol/L (ref 0.0–2.0)
Acid-Base Excess: 1 mmol/L (ref 0.0–2.0)
Acid-Base Excess: 3 mmol/L — ABNORMAL HIGH (ref 0.0–2.0)
Acid-Base Excess: 4 mmol/L — ABNORMAL HIGH (ref 0.0–2.0)
Acid-Base Excess: 2 mmol/L (ref 0.0–2.0)
Acid-Base Excess: 4 mmol/L — ABNORMAL HIGH (ref 0.0–2.0)
Acid-base deficit: 11 mmol/L — ABNORMAL HIGH (ref 0.0–2.0)
Acid-base deficit: 9 mmol/L — ABNORMAL HIGH (ref 0.0–2.0)
Acid-base deficit: 1 mmol/L (ref 0.0–2.0)
Bicarbonate: 16 mmol/L — ABNORMAL LOW (ref 20.0–28.0)
Bicarbonate: 17.5 mmol/L — ABNORMAL LOW (ref 20.0–28.0)
Bicarbonate: 25.1 mmol/L (ref 20.0–28.0)
Bicarbonate: 27.5 mmol/L (ref 20.0–28.0)
Bicarbonate: 28.4 mmol/L — ABNORMAL HIGH (ref 20.0–28.0)
Bicarbonate: 23.7 mmol/L (ref 20.0–28.0)
Bicarbonate: 26.1 mmol/L (ref 20.0–28.0)
Bicarbonate: 24 mmol/L (ref 20.0–28.0)
Bicarbonate: 28.2 mmol/L — ABNORMAL HIGH (ref 20.0–28.0)
Calcium, Ion: 1.12 mmol/L — ABNORMAL LOW (ref 1.15–1.40)
Calcium, Ion: 1.04 mmol/L — ABNORMAL LOW (ref 1.15–1.40)
Calcium, Ion: 1.07 mmol/L — ABNORMAL LOW (ref 1.15–1.40)
Calcium, Ion: 1.09 mmol/L — ABNORMAL LOW (ref 1.15–1.40)
Calcium, Ion: 1.09 mmol/L — ABNORMAL LOW (ref 1.15–1.40)
Calcium, Ion: 1.06 mmol/L — ABNORMAL LOW (ref 1.15–1.40)
Calcium, Ion: 1.11 mmol/L — ABNORMAL LOW (ref 1.15–1.40)
Calcium, Ion: 1.06 mmol/L — ABNORMAL LOW (ref 1.15–1.40)
Calcium, Ion: 1.08 mmol/L — ABNORMAL LOW (ref 1.15–1.40)
HCT: 22 % — ABNORMAL LOW (ref 39.0–52.0)
HCT: 20 % — ABNORMAL LOW (ref 39.0–52.0)
HCT: 20 % — ABNORMAL LOW (ref 39.0–52.0)
HCT: 23 % — ABNORMAL LOW (ref 39.0–52.0)
HCT: 22 % — ABNORMAL LOW (ref 39.0–52.0)
HCT: 27 % — ABNORMAL LOW (ref 39.0–52.0)
HCT: 28 % — ABNORMAL LOW (ref 39.0–52.0)
HCT: 25 % — ABNORMAL LOW (ref 39.0–52.0)
HCT: 26 % — ABNORMAL LOW (ref 39.0–52.0)
Hemoglobin: 7.5 g/dL — ABNORMAL LOW (ref 13.0–17.0)
Hemoglobin: 6.8 g/dL — CL (ref 13.0–17.0)
Hemoglobin: 6.8 g/dL — CL (ref 13.0–17.0)
Hemoglobin: 7.8 g/dL — ABNORMAL LOW (ref 13.0–17.0)
Hemoglobin: 7.5 g/dL — ABNORMAL LOW (ref 13.0–17.0)
Hemoglobin: 9.2 g/dL — ABNORMAL LOW (ref 13.0–17.0)
Hemoglobin: 9.5 g/dL — ABNORMAL LOW (ref 13.0–17.0)
Hemoglobin: 8.5 g/dL — ABNORMAL LOW (ref 13.0–17.0)
Hemoglobin: 8.8 g/dL — ABNORMAL LOW (ref 13.0–17.0)
O2 Saturation: 89 %
O2 Saturation: 87 %
O2 Saturation: 91 %
O2 Saturation: 97 %
O2 Saturation: 97 %
O2 Saturation: 100 %
O2 Saturation: 100 %
O2 Saturation: 86 %
O2 Saturation: 99 %
Patient temperature: 37.7
Patient temperature: 37.8
Patient temperature: 37.9
Patient temperature: 37.3
Patient temperature: 37.4
Patient temperature: 36.6
Patient temperature: 98.8
Potassium: 4.7 mmol/L (ref 3.5–5.1)
Potassium: 4.1 mmol/L (ref 3.5–5.1)
Potassium: 4.2 mmol/L (ref 3.5–5.1)
Potassium: 4.1 mmol/L (ref 3.5–5.1)
Potassium: 4 mmol/L (ref 3.5–5.1)
Potassium: 4.5 mmol/L (ref 3.5–5.1)
Potassium: 5.3 mmol/L — ABNORMAL HIGH (ref 3.5–5.1)
Potassium: 4.1 mmol/L (ref 3.5–5.1)
Potassium: 3.9 mmol/L (ref 3.5–5.1)
Sodium: 145 mmol/L (ref 135–145)
Sodium: 142 mmol/L (ref 135–145)
Sodium: 144 mmol/L (ref 135–145)
Sodium: 145 mmol/L (ref 135–145)
Sodium: 143 mmol/L (ref 135–145)
Sodium: 138 mmol/L (ref 135–145)
Sodium: 139 mmol/L (ref 135–145)
Sodium: 143 mmol/L (ref 135–145)
Sodium: 143 mmol/L (ref 135–145)
TCO2: 17 mmol/L — ABNORMAL LOW (ref 22–32)
TCO2: 19 mmol/L — ABNORMAL LOW (ref 22–32)
TCO2: 26 mmol/L (ref 22–32)
TCO2: 29 mmol/L (ref 22–32)
TCO2: 30 mmol/L (ref 22–32)
TCO2: 25 mmol/L (ref 22–32)
TCO2: 27 mmol/L (ref 22–32)
TCO2: 25 mmol/L (ref 22–32)
TCO2: 29 mmol/L (ref 22–32)
pCO2 arterial: 41 mmHg (ref 32.0–48.0)
pCO2 arterial: 38.7 mmHg (ref 32.0–48.0)
pCO2 arterial: 38.7 mmHg (ref 32.0–48.0)
pCO2 arterial: 43 mmHg (ref 32.0–48.0)
pCO2 arterial: 39.3 mmHg (ref 32.0–48.0)
pCO2 arterial: 37.3 mmHg (ref 32.0–48.0)
pCO2 arterial: 40.3 mmHg (ref 32.0–48.0)
pCO2 arterial: 28.1 mmHg — ABNORMAL LOW (ref 32.0–48.0)
pCO2 arterial: 41 mmHg (ref 32.0–48.0)
pH, Arterial: 7.203 — ABNORMAL LOW (ref 7.350–7.450)
pH, Arterial: 7.267 — ABNORMAL LOW (ref 7.350–7.450)
pH, Arterial: 7.424 (ref 7.350–7.450)
pH, Arterial: 7.415 (ref 7.350–7.450)
pH, Arterial: 7.468 — ABNORMAL HIGH (ref 7.350–7.450)
pH, Arterial: 7.377 (ref 7.350–7.450)
pH, Arterial: 7.452 — ABNORMAL HIGH (ref 7.350–7.450)
pH, Arterial: 7.539 — ABNORMAL HIGH (ref 7.350–7.450)
pH, Arterial: 7.445 (ref 7.350–7.450)
pO2, Arterial: 71 mmHg — ABNORMAL LOW (ref 83.0–108.0)
pO2, Arterial: 63 mmHg — ABNORMAL LOW (ref 83.0–108.0)
pO2, Arterial: 63 mmHg — ABNORMAL LOW (ref 83.0–108.0)
pO2, Arterial: 95 mmHg (ref 83.0–108.0)
pO2, Arterial: 85 mmHg (ref 83.0–108.0)
pO2, Arterial: 182 mmHg — ABNORMAL HIGH (ref 83.0–108.0)
pO2, Arterial: 308 mmHg — ABNORMAL HIGH (ref 83.0–108.0)
pO2, Arterial: 43 mmHg — ABNORMAL LOW (ref 83.0–108.0)
pO2, Arterial: 120 mmHg — ABNORMAL HIGH (ref 83.0–108.0)

## 2020-07-10 LAB — PREPARE PLATELET PHERESIS: Unit division: 0

## 2020-07-10 LAB — BASIC METABOLIC PANEL
Anion gap: 13 (ref 5–15)
Anion gap: 10 (ref 5–15)
BUN: 20 mg/dL (ref 8–23)
BUN: 23 mg/dL (ref 8–23)
CO2: 27 mmol/L (ref 22–32)
CO2: 27 mmol/L (ref 22–32)
Calcium: 7.9 mg/dL — ABNORMAL LOW (ref 8.9–10.3)
Calcium: 8 mg/dL — ABNORMAL LOW (ref 8.9–10.3)
Chloride: 104 mmol/L (ref 98–111)
Chloride: 106 mmol/L (ref 98–111)
Creatinine, Ser: 1.48 mg/dL — ABNORMAL HIGH (ref 0.61–1.24)
Creatinine, Ser: 1.42 mg/dL — ABNORMAL HIGH (ref 0.61–1.24)
GFR, Estimated: 49 mL/min — ABNORMAL LOW (ref >60)
GFR, Estimated: 52 mL/min — ABNORMAL LOW (ref >60)
Glucose, Bld: 135 mg/dL — ABNORMAL HIGH (ref 70–99)
Glucose, Bld: 141 mg/dL — ABNORMAL HIGH (ref 70–99)
Potassium: 3.8 mmol/L (ref 3.5–5.1)
Potassium: 4 mmol/L (ref 3.5–5.1)
Sodium: 144 mmol/L (ref 135–145)
Sodium: 143 mmol/L (ref 135–145)

## 2020-07-10 LAB — GLUCOSE, CAPILLARY
Glucose-Capillary: 117 mg/dL — ABNORMAL HIGH (ref 70–99)
Glucose-Capillary: 118 mg/dL — ABNORMAL HIGH (ref 70–99)
Glucose-Capillary: 124 mg/dL — ABNORMAL HIGH (ref 70–99)
Glucose-Capillary: 132 mg/dL — ABNORMAL HIGH (ref 70–99)
Glucose-Capillary: 134 mg/dL — ABNORMAL HIGH (ref 70–99)
Glucose-Capillary: 138 mg/dL — ABNORMAL HIGH (ref 70–99)
Glucose-Capillary: 142 mg/dL — ABNORMAL HIGH (ref 70–99)
Glucose-Capillary: 146 mg/dL — ABNORMAL HIGH (ref 70–99)
Glucose-Capillary: 151 mg/dL — ABNORMAL HIGH (ref 70–99)
Glucose-Capillary: 155 mg/dL — ABNORMAL HIGH (ref 70–99)
Glucose-Capillary: 158 mg/dL — ABNORMAL HIGH (ref 70–99)
Glucose-Capillary: 163 mg/dL — ABNORMAL HIGH (ref 70–99)
Glucose-Capillary: 169 mg/dL — ABNORMAL HIGH (ref 70–99)
Glucose-Capillary: 202 mg/dL — ABNORMAL HIGH (ref 70–99)
Glucose-Capillary: 218 mg/dL — ABNORMAL HIGH (ref 70–99)

## 2020-07-10 LAB — PREPARE FRESH FROZEN PLASMA
Unit division: 0
Unit division: 0

## 2020-07-10 LAB — POCT I-STAT, CHEM 8
BUN: 22 mg/dL (ref 8–23)
Calcium, Ion: 1.12 mmol/L — ABNORMAL LOW (ref 1.15–1.40)
Chloride: 106 mmol/L (ref 98–111)
Creatinine, Ser: 1.1 mg/dL (ref 0.61–1.24)
Glucose, Bld: 165 mg/dL — ABNORMAL HIGH (ref 70–99)
HCT: 27 % — ABNORMAL LOW (ref 39.0–52.0)
Hemoglobin: 9.2 g/dL — ABNORMAL LOW (ref 13.0–17.0)
Potassium: 4.5 mmol/L (ref 3.5–5.1)
Sodium: 139 mmol/L (ref 135–145)
TCO2: 23 mmol/L (ref 22–32)

## 2020-07-10 LAB — CBC
HCT: 25.6 % — ABNORMAL LOW (ref 39.0–52.0)
HCT: 28.6 % — ABNORMAL LOW (ref 39.0–52.0)
Hemoglobin: 8.5 g/dL — ABNORMAL LOW (ref 13.0–17.0)
Hemoglobin: 9.4 g/dL — ABNORMAL LOW (ref 13.0–17.0)
MCH: 30.5 pg (ref 26.0–34.0)
MCH: 29.5 pg (ref 26.0–34.0)
MCHC: 33.2 g/dL (ref 30.0–36.0)
MCHC: 32.9 g/dL (ref 30.0–36.0)
MCV: 91.8 fL (ref 80.0–100.0)
MCV: 89.7 fL (ref 80.0–100.0)
Platelets: 141 10*3/uL — ABNORMAL LOW (ref 150–400)
Platelets: 140 10*3/uL — ABNORMAL LOW (ref 150–400)
RBC: 2.79 MIL/uL — ABNORMAL LOW (ref 4.22–5.81)
RBC: 3.19 MIL/uL — ABNORMAL LOW (ref 4.22–5.81)
RDW: 13.8 % (ref 11.5–15.5)
RDW: 15.4 % (ref 11.5–15.5)
WBC: 11.5 10*3/uL — ABNORMAL HIGH (ref 4.0–10.5)
WBC: 11.9 10*3/uL — ABNORMAL HIGH (ref 4.0–10.5)
nRBC: 0 % (ref 0.0–0.2)
nRBC: 0 % (ref 0.0–0.2)

## 2020-07-10 LAB — BPAM FFP
Blood Product Expiration Date: 202206252359
Blood Product Expiration Date: 202206252359
ISSUE DATE / TIME: 202206201505
ISSUE DATE / TIME: 202206201633
Unit Type and Rh: 7300
Unit Type and Rh: 7300

## 2020-07-10 LAB — BPAM PLATELET PHERESIS
Blood Product Expiration Date: 202206212359
ISSUE DATE / TIME: 202206201240
Unit Type and Rh: 5100

## 2020-07-10 LAB — PREPARE RBC (CROSSMATCH)

## 2020-07-10 LAB — SURGICAL PATHOLOGY

## 2020-07-10 LAB — MAGNESIUM
Magnesium: 2.9 mg/dL — ABNORMAL HIGH (ref 1.7–2.4)
Magnesium: 3.1 mg/dL — ABNORMAL HIGH (ref 1.7–2.4)

## 2020-07-10 MED ORDER — KETOROLAC TROMETHAMINE 15 MG/ML IJ SOLN: 15.0000 mg | Freq: Once | INTRAMUSCULAR | Status: AC

## 2020-07-10 MED ORDER — SODIUM BICARBONATE 8.4 % IV SOLN
100.0000 meq | Freq: Once | INTRAVENOUS | Status: AC
  Administered 2020-07-10: 100 meq via INTRAVENOUS

## 2020-07-10 MED ORDER — SODIUM CHLORIDE 0.9% IV SOLUTION: Freq: Once | INTRAVENOUS | Status: AC

## 2020-07-10 MED ORDER — SODIUM BICARBONATE 8.4 % IV SOLN
INTRAVENOUS | Status: AC
  Filled 2020-07-10: qty 100

## 2020-07-10 MED ORDER — HALOPERIDOL LACTATE 5 MG/ML IJ SOLN
5.0000 mg | Freq: Four times a day (QID) | INTRAMUSCULAR | Status: DC | PRN
  Administered 2020-07-10 (×2): 5 mg via INTRAVENOUS
  Filled 2020-07-10: qty 1

## 2020-07-10 MED ORDER — MIDODRINE HCL 5 MG PO TABS
10.0000 mg | ORAL_TABLET | Freq: Three times a day (TID) | ORAL | Status: DC
  Administered 2020-07-10: 10 mg via ORAL
  Filled 2020-07-10 (×2): qty 2

## 2020-07-10 MED ORDER — SODIUM BICARBONATE 8.4 % IV SOLN
INTRAVENOUS | Status: AC
  Filled 2020-07-10: qty 50

## 2020-07-10 MED ORDER — LEVALBUTEROL HCL 0.63 MG/3ML IN NEBU: 0.6300 mg | INHALATION_SOLUTION | Freq: Four times a day (QID) | RESPIRATORY_TRACT | Status: DC | PRN

## 2020-07-10 MED ORDER — HALOPERIDOL LACTATE 5 MG/ML IJ SOLN
5.0000 mg | Freq: Four times a day (QID) | INTRAMUSCULAR | Status: DC | PRN
  Administered 2020-07-10: 5 mg via INTRAVENOUS
  Filled 2020-07-10: qty 1

## 2020-07-10 MED ORDER — CHLORHEXIDINE GLUCONATE 0.12 % MT SOLN
15.0000 mL | Freq: Two times a day (BID) | OROMUCOSAL | Status: DC
  Administered 2020-07-10 – 2020-07-13 (×6): 15 mL via OROMUCOSAL
  Filled 2020-07-10 (×3): qty 15

## 2020-07-10 MED ORDER — SODIUM CHLORIDE 0.9 % IV SOLN
INTRAVENOUS | Status: DC | PRN
  Administered 2020-07-10: 250 mL via INTRAVENOUS

## 2020-07-10 MED ORDER — CEFAZOLIN SODIUM-DEXTROSE 2-4 GM/100ML-% IV SOLN
2.0000 g | Freq: Three times a day (TID) | INTRAVENOUS | Status: AC
  Administered 2020-07-10 – 2020-07-11 (×4): 2 g via INTRAVENOUS
  Filled 2020-07-10 (×4): qty 100

## 2020-07-10 MED ORDER — ORAL CARE MOUTH RINSE
15.0000 mL | Freq: Two times a day (BID) | OROMUCOSAL | Status: DC
  Administered 2020-07-10 – 2020-07-13 (×6): 15 mL via OROMUCOSAL

## 2020-07-10 MED ORDER — INSULIN ASPART 100 UNIT/ML IJ SOLN
0.0000 [IU] | INTRAMUSCULAR | Status: DC
  Administered 2020-07-10 – 2020-07-11 (×4): 2 [IU] via SUBCUTANEOUS

## 2020-07-10 MED ORDER — INSULIN DETEMIR 100 UNIT/ML ~~LOC~~ SOLN
20.0000 [IU] | Freq: Every day | SUBCUTANEOUS | Status: DC
  Administered 2020-07-11 – 2020-07-12 (×2): 20 [IU] via SUBCUTANEOUS
  Filled 2020-07-10 (×3): qty 0.2

## 2020-07-10 MED ORDER — KETOROLAC TROMETHAMINE 15 MG/ML IJ SOLN
INTRAMUSCULAR | Status: AC
  Administered 2020-07-10: 15 mg
  Filled 2020-07-10: qty 1

## 2020-07-10 MED ORDER — INSULIN DETEMIR 100 UNIT/ML ~~LOC~~ SOLN
20.0000 [IU] | Freq: Once | SUBCUTANEOUS | Status: AC
  Administered 2020-07-10: 20 [IU] via SUBCUTANEOUS
  Filled 2020-07-10: qty 0.2

## 2020-07-10 MED FILL — Thrombin (Recombinant) For Soln 20000 Unit: CUTANEOUS | Qty: 1 | Status: AC

## 2020-07-10 NOTE — Progress Notes (Signed)
ABG obtained by RN due to patient having difficulty with oxygen saturation.  Results given to MD and orders placed for patient to be on bipap.  Patient placed on bipap and is currently tolerating well at this time.  Will obtain follow up ABG.  Will continue to monitor.    Ref. Range 07/10/2020 12:18  Sample type Unknown ARTERIAL  pH, Arterial Latest Ref Range: 7.350 - 7.450  7.539 (H)  pCO2 arterial Latest Ref Range: 32.0 - 48.0 mmHg 28.1 (L)  pO2, Arterial Latest Ref Range: 83.0 - 108.0 mmHg 43 (L)  TCO2 Latest Ref Range: 22 - 32 mmol/L 25  Acid-Base Excess Latest Ref Range: 0.0 - 2.0 mmol/L 2.0  Bicarbonate Latest Ref Range: 20.0 - 28.0 mmol/L 24.0  O2 Saturation Latest Units: % 86.0  Patient temperature Unknown 36.6 C

## 2020-07-10 NOTE — Anesthesia Postprocedure Evaluation (Signed)
Anesthesia Post Note  Patient: Charles [name]  Procedure(s) Performed: AORTIC VALVE REPLACEMENT (AVR) USING INSPIRIS AORTIC VALVE AND SEPTAL MYOMECTOMY (Chest) TRANSESOPHAGEAL ECHOCARDIOGRAM (TEE)     Patient location during evaluation: SICU Anesthesia Type: General Level of consciousness: sedated Pain management: pain level controlled Vital Signs Assessment: post-procedure vital signs reviewed and stable Respiratory status: patient remains intubated per anesthesia plan Cardiovascular status: stable Postop Assessment: no apparent nausea or vomiting Anesthetic complications: no   No notable events documented.  Last Vitals:  Vitals:   07/10/20 0845 07/10/20 0900  BP: (!) 96/54 (!) 93/50  Pulse: (!) 59 60  Resp: 19 14  Temp: 37.4 C   SpO2: 99% 95%    Last Pain:  Vitals:   07/10/20 0400  TempSrc: Core (Comment)  PainSc: Asleep                 Nelle Don Codey Burling

## 2020-07-10 NOTE — Progress Notes (Addendum)
Patient became super agitated - screaming, thrashing and attempting to get out of bed. Precedex was kept on after extubation and pain medicine given as often as able.   Verbal orders from [facility] for 4 point restraints, toradol 15 mg x1, haldol $RemoveBe'5mg'ANMbWIirV$  x1, and ABG.  ABG results at 0037. Orders to give 2 amps of bicarb, 1 unit of RBC and to recheck ABG in 30 minutes  ABG results at 0111. Orders to give 2 amps of bicarb and recheck ABG in 30 mins. Patient switched from venturi mask to non-breather.  ABG results at 0206. ABG parameters met. RN did not call results.

## 2020-07-10 NOTE — Hospital Course (Addendum)
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  He was recently hospitalized at [facility] for shortness of breath and chest discomfort with exertion and was diagnosed with severe aortic stenosis.  He lives in [place] with his wife and has been followed by cardiology in [facility].  He reports having echocardiograms over the past several years to follow his aortic stenosis.  He has not been very active over the past few years due to a right ankle injury that required multiple surgeries.  He said that he has to walk a fairly long distance to the dining room every day and has experienced substernal chest discomfort and shortness of breath with doing that.  This is relieved with rest.  He denies any orthopnea or PND.  On 03/28/2020 he developed significant chest discomfort radiating to his shoulder and jawline was taken by EMS to the emergency room where he had minimal troponin elevation and a flat trend.  He was admitted under the care of Dr. Algie Coffer and underwent work-up including echo, cardiac cath, and TEE.  2D echo showed a trileaflet aortic valve with a mean gradient of 15 mmHg and peak gradient of 28.5 mmHg.  Left ventricular ejection fraction was 70 to 75% with moderate concentric LVH.  TEE confirmed a severely calcified and thickened aortic valve.  The mean gradient was measured at 30 mmHg with a peak gradient of 65 mmHg.  Left ventricular ejection fraction is 70 to 75% with severe concentric LVH.  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 After discussion at our multidisciplinary heart valve meeting it was felt that TAVR alone would not correct this problem. He says that he feels fairly well and only gets short of breath with  long or fast walks. He denies any orthopnea or PND.  Has had no peripheral edema.  He denies any dizziness or syncope.   Hospital course: The patient and all relevant studies were reviewed by Dr. Laneta Simmers and he was felt to be suitable candidate to proceed with aortic valve replacement with septal myomectomy.  The procedure was scheduled and on 07/09/2020 he was taken to the operating room at which time he underwent aortic valve replacement using a 25 mm Edwards Inspiris  Resilia pericardial valve and septal myomectomy. He tolerated the procedure well and was taken to the surgical intensive care unit in stable condition.  Postoperative hospital course:  Patient was weaned from the ventilator using standard post cardiac surgical protocols without difficulty.  He has remained hemodynamically stable.  Intraoperative findings were notable concentric LVH so plans are to keep heart rate low and avoid inotropes as able.  He did initially require some low-dose norepinephrine.  On postop day #1 Swan-Ganz, arterial line and chest tubes were all removed.  His creatinine on postop day 1 was noted to be 1.48 which is up some from normal baseline so we are holding off on diuresis for now.  He does have an expected acute blood loss anemia which is being monitored clinically and he is being started on iron when tolerating p.o. by postop day 2 his norepinephrine has been weaned off and he continues in a paced rhythm.  He has also been started on midodrine to assist with blood pressure management.  His blood pressure is improving and  hopefully will not require at time of discharge.  He is using BiPAP due to hypoxemia related to atelectasis and this is being weaned.  He is also been started on diuretics due to significant expected postoperative volume excess.  He has been started with both Lasix and metazalone.  Volume status does continue to improve.  He has been started on iron for his postoperative anemia and it is noted he  did require postoperative transfusion.  By the third postoperative day, he was weaned from the BiPAP and was able to maintain adequate oxygen saturation on high flow nasal cannula oxygen.  Diuresis was continued.  He was given supplemental iron for his expected anemia in addition to the transfusion he received earlier in admission.  He developed atrial fibrillation that was initially treated with amiodarone infusion.  After he had converted back to sinus rhythm, he was transitioned to oral amiodarone.  He was also started on oral Eliquis.  His blood pressure recovered and Midodrine was discontinued.  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  The swelling persisted and xray was obtained.  This showed evidence of osteoarthritis but no fracture or deformity.   His blood pressure remained stable after the midodrine was discontinued.  He was started on low-dose metoprolol. He regained independence with his mobility and was maintaining oxygen saturations in the high 90s on room air.  Incisions were healing with no sign of complication.  He was tolerating a diet and having appropriate bowel bladder function.

## 2020-07-10 NOTE — Progress Notes (Signed)
This RN ordered violent restraints (at 0032) instead of non-violent restraints. Restraints re-ordered under non-violent restraints.

## 2020-07-10 NOTE — Progress Notes (Signed)
EVENING ROUNDS NOTE :     [address]       [place] [id number]             [phone]                 1 Day Post-Op Procedure(s) (LRB): AORTIC VALVE REPLACEMENT (AVR) USING INSPIRIS AORTIC VALVE AND SEPTAL MYOMECTOMY (N/A) TRANSESOPHAGEAL ECHOCARDIOGRAM (TEE) (N/A)   Total Length of Stay:  LOS: 1 day  Events:   On bipap.  Sats and gas improved.  Desatted quickly when off. On levo @4 .  Continue bipap tonight    BP (!) 144/48   Pulse 60   Temp 98.4 F (36.9 C) (Axillary)   Resp 15   Ht 6' (1.829 m)   Wt 103.7 kg   SpO2 95%   BMI 31.01 kg/m   PAP: (26-47)/(10-26) 29/13 CO:  [3.6 L/min-5.8 L/min] 4.6 L/min CI:  [1.7 L/min/m2-2.7 L/min/m2] 2.1 L/min/m2  Vent Mode: BIPAP FiO2 (%):  [40 %-100 %] 100 % Set Rate:  [4 bmp-10 bmp] 10 bmp Vt Set:  mL] 620 mL PEEP:  [5 cmH20-8 cmH20] 8 cmH20 Plateau Pressure:  [15 cmH20] 15 cmH20   sodium chloride 10 mL/hr at 07/10/20 1600    ceFAZolin (ANCEF) IV Stopped (07/10/20 1549)   insulin Stopped (07/10/20 0944)   lactated ringers 20 mL/hr at 07/09/20 2336   lactated ringers 20 mL/hr at 07/10/20 1600   nitroGLYCERIN     norepinephrine (LEVOPHED) Adult infusion 4 mcg/min (07/10/20 1600)   phenylephrine (NEO-SYNEPHRINE) Adult infusion 30 mcg/min (07/10/20 0000)    I/O last 3 completed shifts: In: 7931.8 [I.V.:4272.2; Blood:1831.7; Other:30; IV Piggyback:1797.9] Out: 3815 [Urine:1720; Emesis/NG output:75; Blood:830; Chest Tube:1190]   CBC Latest Ref Rng & Units 07/10/2020 07/10/2020 07/10/2020  WBC 4.0 - 10.5 K/uL 11.9(H) - -  Hemoglobin 13.0 - 17.0 g/dL 07/12/2020) 6.3(O) 7.5(I)  Hematocrit 39.0 - 52.0 % 28.6(L) 26.0(L) 25.0(L)  Platelets 150 - 400 K/uL 140(L) - -    BMP Latest Ref Rng & Units 07/10/2020 07/10/2020 07/10/2020  Glucose 70 - 99 mg/dL 07/12/2020) - -  BUN 8 - 23 mg/dL 23 - -  Creatinine 295(J - 1.24 mg/dL 8.84) - -  Sodium 1.66(A - 145 mmol/L 143 143 143  Potassium 3.5 - 5.1 mmol/L 4.0 3.9 4.1   Chloride 98 - 111 mmol/L 106 - -  CO2 22 - 32 mmol/L 27 - -  Calcium 8.9 - 10.3 mg/dL 8.0(L) - -    ABG    Component Value Date/Time   PHART 7.445 07/10/2020 1422   PCO2ART 41.0 07/10/2020 1422   PO2ART 120 (H) 07/10/2020 1422   HCO3 28.2 (H) 07/10/2020 1422   TCO2 29 07/10/2020 1422   ACIDBASEDEF 9.0 (H) 07/10/2020 0111   O2SAT 99.0 07/10/2020 1422       07/12/2020, MD 07/10/2020 5:55 PM

## 2020-07-10 NOTE — Progress Notes (Signed)
1 Day Post-Op Procedure(s) (LRB): AORTIC VALVE REPLACEMENT (AVR) USING INSPIRIS AORTIC VALVE AND SEPTAL MYOMECTOMY (N/A) TRANSESOPHAGEAL ECHOCARDIOGRAM (TEE) (N/A) Subjective:  Says he doesn't feel well this am. Had a rough night with agitated delirium requiring Haldol and restraints.   Objective: Vital signs in last 24 hours: Temp:  [97.34 F (36.3 C)-100.22 F (37.9 C)] 99.14 F (37.3 C) (06/21 0700) Pulse Rate:  [59-87] 64 (06/21 0700) Cardiac Rhythm: Atrial paced (06/21 0400) Resp:  [10-22] 22 (06/21 0700) BP: (76-119)/(45-61) 95/45 (06/21 0700) SpO2:  [86 %-100 %] 98 % (06/21 0700) Arterial Line BP: (69-154)/(35-54) 102/44 (06/21 0700) FiO2 (%):  [40 %-50 %] 50 % (06/21 0000) Weight:  [103.7 kg] 103.7 kg (06/21 0500)  Hemodynamic parameters for last 24 hours: PAP: (28-47)/(13-26) 35/20 CO:  [2.9 L/min-5.8 L/min] 3.8 L/min CI:  [1.4 L/min/m2-2.7 L/min/m2] 1.8 L/min/m2  Intake/Output from previous day: 06/20 0701 - 06/21 0700 In: 7931.8 [I.V.:4272.2; Blood:1831.7; IV Piggyback:1797.9] Out: 3815 [Urine:1720; Emesis/NG output:75; Blood:830; Chest Tube:1190] Intake/Output this shift: No intake/output data recorded.  General appearance: alert and cooperative Neurologic: intact Heart: regular rate and rhythm, S1, S2 normal, 2/6 systolic murmur LLSB Lungs: clear to auscultation bilaterally Extremities: edema mild Wound: dressings dry   Lab Results: Recent Labs    07/09/20 1846 07/09/20 2024 07/10/20 0510 07/10/20 0601  WBC 9.6  --  11.5*  --   HGB 8.3*   < > 8.5* 7.5*  HCT 26.0*   < > 25.6* 22.0*  PLT 163  --  141*  --    < > = values in this interval not displayed.   BMET:  Recent Labs    07/09/20 1846 07/09/20 2024 07/10/20 0510 07/10/20 0601  NA 138   < > 144 143  K 4.8   < > 3.8 4.0  CL 108  --  104  --   CO2 23  --  27  --   GLUCOSE 153*  --  135*  --   BUN 19  --  20  --   CREATININE 1.27*  --  1.48*  --   CALCIUM 7.9*  --  7.9*  --    < >  = values in this interval not displayed.    PT/INR:  Recent Labs    07/09/20 1333  LABPROT 16.4*  INR 1.3*   ABG    Component Value Date/Time   PHART 7.468 (H) 07/10/2020 0601   HCO3 28.4 (H) 07/10/2020 0601   TCO2 30 07/10/2020 0601   ACIDBASEDEF 9.0 (H) 07/10/2020 0111   O2SAT 97.0 07/10/2020 0601   CBG (last 3)  Recent Labs    07/10/20 0509 07/10/20 0558 07/10/20 0706  GLUCAP 132* 118* 124*    CXR: small bilateral pleural effusions and bibasilar atelectasis.  Assessment/Plan: S/P Procedure(s) (LRB): AORTIC VALVE REPLACEMENT (AVR) USING INSPIRIS AORTIC VALVE AND SEPTAL MYOMECTOMY (N/A) TRANSESOPHAGEAL ECHOCARDIOGRAM (TEE) (N/A)  POD 1 Hemodynamics fairly stable. With long anterior mitral leaflet, LVH and SAM it would be best to keep HR low, avoid inotropes and keep volume status full. Hold off on beta blockers for now while on some NE. HR 60 with internal pacer.  Atelectasis: work on IS, OOB.  Dangle and DC chest tubes.  DC swan, arterial line.  Creat 1.48 this am up from normal baseline. Hold off on diuresis.  Acute postop blood loss anemia: start iron when taking po.  Hyperglycemia with no hx of DM. Will give some Levemir and start SSI to transition  off insulin drip.  Hx of alcohol and drug abuse, just finished inpt rehab. Minimize narcotics. Don't want to use more toradol with rise in creat.   LOS: 1 day    [name] 07/10/2020

## 2020-07-11 ENCOUNTER — Inpatient Hospital Stay (HOSPITAL_COMMUNITY): Payer: Medicare HMO

## 2020-07-11 LAB — POCT I-STAT 7, (LYTES, BLD GAS, ICA,H+H)
Acid-Base Excess: 5 mmol/L — ABNORMAL HIGH (ref 0.0–2.0)
Bicarbonate: 28.7 mmol/L — ABNORMAL HIGH (ref 20.0–28.0)
Calcium, Ion: 1.13 mmol/L — ABNORMAL LOW (ref 1.15–1.40)
HCT: 26 % — ABNORMAL LOW (ref 39.0–52.0)
Hemoglobin: 8.8 g/dL — ABNORMAL LOW (ref 13.0–17.0)
O2 Saturation: 91 %
Patient temperature: 98.5
Potassium: 3.9 mmol/L (ref 3.5–5.1)
Sodium: 145 mmol/L (ref 135–145)
TCO2: 30 mmol/L (ref 22–32)
pCO2 arterial: 35.5 mmHg (ref 32.0–48.0)
pH, Arterial: 7.515 — ABNORMAL HIGH (ref 7.350–7.450)
pO2, Arterial: 54 mmHg — ABNORMAL LOW (ref 83.0–108.0)

## 2020-07-11 LAB — CBC
HCT: 27 % — ABNORMAL LOW (ref 39.0–52.0)
Hemoglobin: 8.8 g/dL — ABNORMAL LOW (ref 13.0–17.0)
MCH: 29.5 pg (ref 26.0–34.0)
MCHC: 32.6 g/dL (ref 30.0–36.0)
MCV: 90.6 fL (ref 80.0–100.0)
Platelets: 119 10*3/uL — ABNORMAL LOW (ref 150–400)
RBC: 2.98 MIL/uL — ABNORMAL LOW (ref 4.22–5.81)
RDW: 15.8 % — ABNORMAL HIGH (ref 11.5–15.5)
WBC: 12.3 10*3/uL — ABNORMAL HIGH (ref 4.0–10.5)
nRBC: 0.2 % (ref 0.0–0.2)

## 2020-07-11 LAB — GLUCOSE, CAPILLARY
Glucose-Capillary: 159 mg/dL — ABNORMAL HIGH (ref 70–99)
Glucose-Capillary: 134 mg/dL — ABNORMAL HIGH (ref 70–99)
Glucose-Capillary: 157 mg/dL — ABNORMAL HIGH (ref 70–99)
Glucose-Capillary: 145 mg/dL — ABNORMAL HIGH (ref 70–99)
Glucose-Capillary: 135 mg/dL — ABNORMAL HIGH (ref 70–99)

## 2020-07-11 LAB — BASIC METABOLIC PANEL
Anion gap: 9 (ref 5–15)
Anion gap: 8 (ref 5–15)
BUN: 22 mg/dL (ref 8–23)
BUN: 18 mg/dL (ref 8–23)
CO2: 27 mmol/L (ref 22–32)
CO2: 28 mmol/L (ref 22–32)
Calcium: 8.2 mg/dL — ABNORMAL LOW (ref 8.9–10.3)
Calcium: 8.5 mg/dL — ABNORMAL LOW (ref 8.9–10.3)
Chloride: 106 mmol/L (ref 98–111)
Chloride: 106 mmol/L (ref 98–111)
Creatinine, Ser: 1.26 mg/dL — ABNORMAL HIGH (ref 0.61–1.24)
Creatinine, Ser: 1.18 mg/dL (ref 0.61–1.24)
GFR, Estimated: 59 mL/min — ABNORMAL LOW (ref >60)
GFR, Estimated: >60 mL/min (ref >60)
Glucose, Bld: 159 mg/dL — ABNORMAL HIGH (ref 70–99)
Glucose, Bld: 144 mg/dL — ABNORMAL HIGH (ref 70–99)
Potassium: 3.7 mmol/L (ref 3.5–5.1)
Potassium: 3.7 mmol/L (ref 3.5–5.1)
Sodium: 142 mmol/L (ref 135–145)
Sodium: 142 mmol/L (ref 135–145)

## 2020-07-11 LAB — TYPE AND SCREEN
ABO/RH(D): B POS
Antibody Screen: NEGATIVE
Unit division: 0
Unit division: 0

## 2020-07-11 LAB — BPAM RBC
Blood Product Expiration Date: 202206252359
Blood Product Expiration Date: 202206302359
ISSUE DATE / TIME: 202206210127
ISSUE DATE / TIME: 202206211136
Unit Type and Rh: 7300
Unit Type and Rh: 9500

## 2020-07-11 LAB — PHOSPHORUS: Phosphorus: 2.6 mg/dL (ref 2.5–4.6)

## 2020-07-11 LAB — MAGNESIUM: Magnesium: 2.7 mg/dL — ABNORMAL HIGH (ref 1.7–2.4)

## 2020-07-11 MED ORDER — HALOPERIDOL LACTATE 5 MG/ML IJ SOLN: 5.0000 mg | Freq: Four times a day (QID) | INTRAMUSCULAR | Status: DC | PRN

## 2020-07-11 MED ORDER — INSULIN ASPART 100 UNIT/ML IJ SOLN
0.0000 [IU] | Freq: Three times a day (TID) | INTRAMUSCULAR | Status: DC
  Administered 2020-07-11 (×2): 2 [IU] via SUBCUTANEOUS

## 2020-07-11 MED ORDER — POTASSIUM CHLORIDE 10 MEQ/50ML IV SOLN
10.0000 meq | INTRAVENOUS | Status: AC
  Administered 2020-07-11 (×3): 10 meq via INTRAVENOUS
  Filled 2020-07-11 (×3): qty 50

## 2020-07-11 MED ORDER — POTASSIUM CHLORIDE CRYS ER 20 MEQ PO TBCR
20.0000 meq | EXTENDED_RELEASE_TABLET | Freq: Two times a day (BID) | ORAL | Status: AC
  Administered 2020-07-11 (×2): 20 meq via ORAL
  Filled 2020-07-11 (×2): qty 1

## 2020-07-11 MED ORDER — FE FUMARATE-B12-VIT C-FA-IFC PO CAPS
1.0000 | ORAL_CAPSULE | Freq: Two times a day (BID) | ORAL | Status: DC
  Administered 2020-07-11 – 2020-07-17 (×12): 1 via ORAL
  Filled 2020-07-11 (×12): qty 1

## 2020-07-11 MED ORDER — ENOXAPARIN SODIUM 40 MG/0.4ML IJ SOSY
40.0000 mg | PREFILLED_SYRINGE | INTRAMUSCULAR | Status: DC
  Administered 2020-07-11 – 2020-07-13 (×3): 40 mg via SUBCUTANEOUS
  Filled 2020-07-11 (×3): qty 0.4

## 2020-07-11 MED ORDER — METOLAZONE 2.5 MG PO TABS
2.5000 mg | ORAL_TABLET | Freq: Once | ORAL | Status: AC
  Administered 2020-07-11: 2.5 mg via ORAL
  Filled 2020-07-11: qty 1

## 2020-07-11 MED ORDER — FUROSEMIDE 10 MG/ML IJ SOLN
40.0000 mg | Freq: Two times a day (BID) | INTRAMUSCULAR | Status: AC
  Administered 2020-07-11 (×2): 40 mg via INTRAVENOUS
  Filled 2020-07-11 (×2): qty 4

## 2020-07-11 MED ORDER — MIDODRINE HCL 5 MG PO TABS: 5.0000 mg | ORAL_TABLET | Freq: Three times a day (TID) | ORAL | Status: DC

## 2020-07-11 NOTE — Progress Notes (Signed)
      [address]       [place] [address]             [phone]      POD # 2 AVR  Up in chair on BIPAP  BP (!) 109/54   Pulse 100   Temp 99 F (37.2 C) (Axillary)   Resp 16   Ht 6' (1.829 m)   Wt 105 kg   SpO2 99%   BMI 31.39 kg/m    Intake/Output Summary (Last 24 hours) at 07/11/2020 1754 Last data filed at 07/11/2020 1700 Gross per 24 hour  Intake 961.63 ml  Output 3492 ml  Net -2530.37 ml   K= 3.9 Hct 26  Slowly progressing  Viviann Spare C. Dorris Fetch, MD [facility] [phone]

## 2020-07-11 NOTE — Progress Notes (Signed)
Pt yelled out at this time "hey!"  When nurse entered the room, asked patient "Can I help you?"  Pt responded "No, I don't need anything. Get out of my house."  Nurse offered re-orientation that he was in the hospital, and he had heart surgery.  Pt stated "No I am not. You are crazy. Get out of here before I make you."  Bed alarm on, Call light in reach.

## 2020-07-11 NOTE — Progress Notes (Signed)
Pt ambulated from chair to bed at this time

## 2020-07-11 NOTE — Discharge Summary (Signed)
[address]       [place] [address]             [phone]    Physician Discharge Summary  Patient ID: Charles [name] MRN: [id number] DOB/AGE: 10-19-1945 75 y.o.  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  He was recently hospitalized at [facility] for shortness of breath and chest discomfort with exertion and was diagnosed with severe aortic stenosis.  He lives in [place] with his wife and has been followed by cardiology in [facility].  He reports having echocardiograms over the past several years to follow his aortic stenosis.  He has not been very active over the past few years due to a right ankle injury that required multiple surgeries.  He said that he has to walk a fairly long distance to the dining room every day and has experienced substernal chest discomfort and shortness of breath with doing that.  This is relieved with rest.  He denies any orthopnea or PND.  On 03/28/2020 he developed significant chest discomfort radiating to his shoulder and jawline was taken by EMS to the emergency room where he had minimal troponin elevation and a flat trend.  He was admitted under the care of Dr. Algie Coffer and underwent  work-up including echo, cardiac cath, and TEE.  2D echo showed a trileaflet aortic valve with a mean gradient of 15 mmHg and peak gradient of 28.5 mmHg.  Left ventricular ejection fraction was 70 to 75% with moderate concentric LVH.  TEE confirmed a severely calcified and thickened aortic valve.  The mean gradient was measured at 30 mmHg with a peak gradient of 65 mmHg.  Left ventricular ejection fraction is 70 to 75% with severe concentric LVH.  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 After discussion at our multidisciplinary heart valve meeting it was felt that TAVR alone would not correct this problem. He says that he feels fairly well and only gets short of breath with long or fast walks. He denies any orthopnea or PND.  Has had no peripheral edema.  He denies any dizziness or syncope.   Hospital course: The patient and all relevant studies were reviewed by Dr. Laneta Simmers and he was felt to be suitable candidate to proceed with aortic valve replacement with septal myomectomy.  The procedure was scheduled and on 07/09/2020 he was taken to the operating room at which time he underwent aortic valve replacement using a 25 mm Edwards Inspiris  Resilia pericardial valve and septal myomectomy. He tolerated the procedure well and was taken to the surgical intensive care unit in stable condition.  Postoperative hospital course:  Patient was weaned from the ventilator using standard post cardiac surgical protocols without difficulty.  He has remained hemodynamically stable.  Intraoperative findings were notable concentric LVH so plans are to keep heart rate low and avoid inotropes as able.  He did initially require some low-dose norepinephrine.  On postop day #1 Swan-Ganz, arterial line and chest tubes were all removed.  His creatinine on postop day 1 was noted to be 1.48 which is up some from normal baseline so we are holding off on diuresis for now.  He does have an expected acute blood  loss anemia which is being monitored clinically and he is being started on iron when tolerating p.o. by postop day 2 his norepinephrine has been weaned off and he continues in a paced rhythm.  He has also been started on midodrine to assist with blood pressure management.  His blood pressure is improving and hopefully will not require at time of discharge.  He is using BiPAP due to hypoxemia related to atelectasis and this is being weaned.  He is also been started on diuretics due to significant expected postoperative volume excess.  He has been started with both Lasix and metazalone.  Volume status does continue to improve.  He has been started on iron for his postoperative anemia and it is noted he did require postoperative transfusion.  By the third postoperative day, he was weaned from the BiPAP and was able to maintain adequate oxygen saturation on high flow nasal cannula oxygen.  Diuresis was continued.  He was given supplemental iron for his expected anemia in addition to the transfusion he received earlier in admission.  He developed atrial fibrillation that was initially treated with amiodarone infusion.  After he had converted back to sinus rhythm, he was transitioned to oral amiodarone.  He was also started on oral Eliquis.  His blood pressure recovered and Midodrine was discontinued.  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  The swelling persisted and xray was obtained.  This showed evidence of osteoarthritis but no fracture or deformity.   His blood pressure remained stable after the midodrine was discontinued.  He was started on low-dose metoprolol. He regained independence with his mobility and was maintaining oxygen saturations in the high 90s on room air.  Incisions were healing with no sign of complication.  He was tolerating a diet and having appropriate bowel bladder function.      Consults: None  Significant Diagnostic Studies:   CLINICAL DATA:  Post  aortic valve replacement.   EXAM: PORTABLE CHEST 1 VIEW   COMPARISON:  07/12/2020   FINDINGS: Right jugular central line is still present. There is a prosthetic aortic valve. Stable position of the dual chamber cardiac pacemaker. Persistent bibasilar densities are compatible with small pleural effusions and compressive atelectasis. Heart size is upper limits of normal but stable. Again noted is a surgical plate in the lower cervical spine. Hazy interstitial lung densities bilaterally that have not significantly changed. Negative for a pneumothorax.   IMPRESSION: 1. Stable chest radiograph findings. Mild interstitial prominence and evidence for small bilateral pleural effusions. 2. Stable appearance of the support apparatuses.     Electronically Signed   By: Richarda Overlie M.D.   On: 07/13/2020 08:52   EXAM: RIGHT HAND - COMPLETE 3+ VIEW   COMPARISON:  None.   FINDINGS: Frontal, oblique, and lateral views obtained. No fracture or dislocation. There is diffuse narrowing of the radiocarpal joint. There is severe narrowing of the first carpal-metacarpal joint with moderate narrowing of the scaphotrapezial joint. There is moderately severe narrowing of the second MCP joint. There is osteoarthritic change with spurring in the first IP joint as well as in the second and fifth PIP  as well as in the second, fourth, and fifth D IP joints. No erosions.   IMPRESSION: Multilevel osteoarthritic change.  No fracture or spondylolisthesis.     Electronically Signed   By: Bretta BangWilliam  Woodruff III M.D.   On: 07/16/2020 08:49  Treatments:   CARDIOVASCULAR SURGERY OPERATIVE NOTE   07/09/2020 Dayton BailiffJohn H Dibiasio III [id number]   Surgeon:  Alleen BorneBryan K. Bartle, MD   First Assistant: Gershon CraneWayne Gold,  PA-C     Preoperative Diagnosis:  Severe aortic stenosis     Postoperative Diagnosis:  Same     Procedure:   Median Sternotomy Extracorporeal circulation 3.   Aortic valve replacement using a  25 mm Edwards INSPIRIS RESILIA pericardial valve. 4    Septal myectomy   Anesthesia:  General Endotracheal     Clinical History/Surgical Indication:   This 75 year old gentleman has stage D, moderate to severe, symptomatic aortic stenosis with New York Heart Association class II-III symptoms of exertional fatigue and shortness of breath consistent with chronic diastolic congestive heart failure.  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 It is felt that the best treatment is to perform AVR with septal myectomy and hopefully this will resolve his SAM and MR. It is still possible that he may require MV repair or replacement but I think that is unlikely. I discussed the operative procedure with the patient and his son including alternatives, benefits and risks; including but not limited to bleeding, blood transfusion, infection, stroke, myocardial infarction, graft failure, heart block requiring a permanent pacemaker, organ dysfunction, and death.  Dayton BailiffJohn H Titsworth III understands and agrees to proceed.       Discharge Exam: Blood pressure (!) 116/41, pulse 87, temperature 98.5 F (36.9 C), temperature source Oral, resp. rate 16, height 6' (1.829 m), weight 96.1 kg, SpO2 100 %.   General appearance: alert and cooperative Neurologic: intact Heart: regular rate and rhythm expected soft systolic murmur consistent with prosthetic tissue aortic valve  Lungs: clear to auscultation bilaterally Extremities: no edema in legs.  Right hand x-ray showed multilevel osteoarthritic change, no fracture or spondylolisthesis Wound: The sternal incision is healing well.    Discharge Medications:  The patient has been discharged on:   1.Beta Blocker:  Yes [ x ]                              No   [   ]                              If No, reason:  2.Ace Inhibitor/ARB: Yes [   ]                                      No  [  x ]                                     If No, reason: Soft blood pressure f postoperatively   3.Statin:   Yes [ x ]                  No  [   ]  If No, reason:  4.Ecasa:  Yes  [  x]                  No   [   ]                  If No, reason:     Allergies as of 07/17/2020       Reactions   Shellfish-derived Products Anaphylaxis   Gabapentin Other (See Comments)   Hallucinations   Tramadol Other (See Comments)   Hallucinations   Moxifloxacin Nausea Only        Medication List     STOP taking these medications    lisinopril 20 MG tablet Commonly known as: ZESTRIL       TAKE these medications    acetaminophen 325 MG tablet Commonly known as: TYLENOL Take 2 tablets (650 mg total) by mouth every 6 (six) hours as needed for mild pain.   amiodarone 200 MG tablet Commonly known as: PACERONE Take 2 tablets (400 mg total) by mouth 2 (two) times daily. Take 2 tablets (400 mg ) by mouth twice daily for 4 days then decrease the dose to 1 tablet (200 mg) by mouth twice daily thereafter.   apixaban 5 MG Tabs tablet Commonly known as: ELIQUIS Take 1 tablet (5 mg total) by mouth 2 (two) times daily.   aspirin 81 MG EC tablet Take 81 mg by mouth daily.   atorvastatin 40 MG tablet Commonly known as: LIPITOR Take 40 mg by mouth daily.   DULoxetine 60 MG capsule Commonly known as: CYMBALTA Take 120 mg by mouth daily.   furosemide 20 MG tablet Commonly known as: LASIX Take 20 mg by mouth daily. What changed: Another medication with the same name was added. Make sure you understand how and when to take each.   furosemide 40 MG tablet Commonly known as: LASIX Take 1 tablet (40 mg total) by mouth daily for 7 days. Start taking on: July 18, 2020 What changed: You were already taking a medication with the same name, and this prescription was added. Make sure you understand how and when to take each.   lamoTRIgine 25 MG  tablet Commonly known as: LAMICTAL Take 75 mg by mouth daily.   metoprolol tartrate 25 MG tablet Commonly known as: LOPRESSOR Take 1 tablet (25 mg total) by mouth 2 (two) times daily. What changed:  medication strength how much to take   Multi Complete Caps Take 1 capsule by mouth daily.   nitroGLYCERIN 0.4 MG SL tablet Commonly known as: NITROSTAT Place 0.4 mg under the tongue every 5 (five) minutes as needed for chest pain (max 3 doses).   omeprazole 20 MG capsule Commonly known as: PRILOSEC Take 20 mg by mouth at bedtime.   oxyCODONE 5 MG immediate release tablet Commonly known as: Oxy IR/ROXICODONE Take 1 tablet (5 mg total) by mouth every 4 (four) hours as needed for up to 5 days for severe pain.   potassium chloride SA 20 MEQ tablet Commonly known as: KLOR-CON Take 1 tablet (20 mEq total) by mouth daily for 7 days.   saccharomyces boulardii 250 MG capsule Commonly known as: FLORASTOR Take 250 mg by mouth 2 (two) times daily.   traZODone 50 MG tablet Commonly known as: DESYREL Take 150 mg by mouth at bedtime.        Follow-up Information     Orpah Cobb, MD. Schedule an appointment as soon as possible for a visit in  2 week(s).   Specialty: Cardiology Contact information: [address] [place] [address] [phone]         [name], MD Follow up on 08/08/2020.   Specialty: Cardiothoracic Surgery Why: Your appointment is at 3:15 PM.  Please obtain a chest x-ray at [facility] 1/2-hour prior to appointment.  It is located in the same office complex on the first floor. Contact information: [address] [place] [address] [phone]         [facility] Go on 07/25/2020.   Specialty: Cardiothoracic Surgery Why: Your appointment for suture removal is at 11 AM Contact information: [address] [place], [address] [place] [address] [phone]                 Signed: Gaynelle Arabian, PA-C 07/17/2020, 10:04 AM

## 2020-07-11 NOTE — Progress Notes (Signed)
2 Days Post-Op Procedure(s) (LRB): AORTIC VALVE REPLACEMENT (AVR) USING INSPIRIS AORTIC VALVE AND SEPTAL MYOMECTOMY (N/A) TRANSESOPHAGEAL ECHOCARDIOGRAM (TEE) (N/A) Subjective: No complaints this am.  He had some confusion overnight and required some Haldol but no restraints. Has been on 90% bipap overnight.  Objective: Vital signs in last 24 hours: Temp:  [97.7 F (36.5 C)-99.32 F (37.4 C)] 98.5 F (36.9 C) (06/22 0342) Pulse Rate:  [44-101] 92 (06/22 0700) Cardiac Rhythm: Atrial paced (06/22 0400) Resp:  [10-31] 20 (06/22 0700) BP: (76-169)/(45-141) 139/61 (06/22 0700) SpO2:  [84 %-100 %] 96 % (06/22 0700) Arterial Line BP: (99-213)/(2-65) 171/51 (06/22 0700) FiO2 (%):  [50 %-100 %] 90 % (06/22 0330) Weight:  [105 kg] 105 kg (06/22 0500)  Hemodynamic parameters for last 24 hours: PAP: (26-29)/(10-18) 29/13 CO:  [4.6 L/min] 4.6 L/min CI:  [2.1 L/min/m2] 2.1 L/min/m2  Intake/Output from previous day: 06/21 0701 - 06/22 0700 In: 1432.1 [I.V.:724.3; Blood:350; IV Piggyback:357.8] Out: 797 [Urine:717; Chest Tube:80] Intake/Output this shift: No intake/output data recorded.  General appearance: alert and cooperative Neurologic: intact Heart: regular rate and rhythm, S1, S2 normal, 2/6 systolic murmur Lungs: clear to auscultation bilaterally Extremities: edema mild Wound: dressings dry  Lab Results: Recent Labs    07/10/20 1621 07/11/20 0251  WBC 11.9* 12.3*  HGB 9.4* 8.8*  HCT 28.6* 27.0*  PLT 140* 119*   BMET:  Recent Labs    07/10/20 1621 07/11/20 0251  NA 143 142  K 4.0 3.7  CL 106 106  CO2 27 27  GLUCOSE 141* 159*  BUN 23 22  CREATININE 1.42* 1.26*  CALCIUM 8.0* 8.2*    PT/INR:  Recent Labs    07/09/20 1333  LABPROT 16.4*  INR 1.3*   ABG    Component Value Date/Time   PHART 7.445 07/10/2020 1422   HCO3 28.2 (H) 07/10/2020 1422   TCO2 29 07/10/2020 1422   ACIDBASEDEF 9.0 (H) 07/10/2020 0111   O2SAT 99.0 07/10/2020 1422   CBG  (last 3)  Recent Labs    07/10/20 1937 07/10/20 2341 07/11/20 0333  GLUCAP 117* 155* 159*   CXR: bibasilar atelectasis and small left pleural effusion  Assessment/Plan: S/P Procedure(s) (LRB): AORTIC VALVE REPLACEMENT (AVR) USING INSPIRIS AORTIC VALVE AND SEPTAL MYOMECTOMY (N/A) TRANSESOPHAGEAL ECHOCARDIOGRAM (TEE) (N/A)  POD 2 Hemodynamically stable in paced rhythm off NE. On midodrine 10 tid. Holding on beta blocker for now until stable off midodrine.  Hypoxemia due to atelectasis requiring bipap. Will try off bipap today and work on IS, flutter valve, OOB. Diuresis should help.  Volume excess: wt is 31 lbs over preop if accurate. Start diuresis with lasix and will give a dose of metolazone today. Replace K+  Glucose under good control and Levemir and SSI.  Acute postop blood loss anemia: Hgb stable after transfusion yesterday. Start iron.   LOS: 2 days    [name] 07/11/2020

## 2020-07-12 ENCOUNTER — Inpatient Hospital Stay (HOSPITAL_COMMUNITY): Payer: Medicare HMO

## 2020-07-12 LAB — BASIC METABOLIC PANEL
Anion gap: 8 (ref 5–15)
Anion gap: 9 (ref 5–15)
BUN: 17 mg/dL (ref 8–23)
BUN: 20 mg/dL (ref 8–23)
CO2: 29 mmol/L (ref 22–32)
CO2: 29 mmol/L (ref 22–32)
Calcium: 8.9 mg/dL (ref 8.9–10.3)
Calcium: 8.9 mg/dL (ref 8.9–10.3)
Chloride: 105 mmol/L (ref 98–111)
Chloride: 101 mmol/L (ref 98–111)
Creatinine, Ser: 1.05 mg/dL (ref 0.61–1.24)
Creatinine, Ser: 1.13 mg/dL (ref 0.61–1.24)
GFR, Estimated: >60 mL/min (ref >60)
GFR, Estimated: >60 mL/min (ref >60)
Glucose, Bld: 124 mg/dL — ABNORMAL HIGH (ref 70–99)
Glucose, Bld: 120 mg/dL — ABNORMAL HIGH (ref 70–99)
Potassium: 3.8 mmol/L (ref 3.5–5.1)
Potassium: 3.4 mmol/L — ABNORMAL LOW (ref 3.5–5.1)
Sodium: 142 mmol/L (ref 135–145)
Sodium: 139 mmol/L (ref 135–145)

## 2020-07-12 LAB — CBC
HCT: 26.3 % — ABNORMAL LOW (ref 39.0–52.0)
Hemoglobin: 8.5 g/dL — ABNORMAL LOW (ref 13.0–17.0)
MCH: 29.7 pg (ref 26.0–34.0)
MCHC: 32.3 g/dL (ref 30.0–36.0)
MCV: 92 fL (ref 80.0–100.0)
Platelets: 115 10*3/uL — ABNORMAL LOW (ref 150–400)
RBC: 2.86 MIL/uL — ABNORMAL LOW (ref 4.22–5.81)
RDW: 15.4 % (ref 11.5–15.5)
WBC: 10.5 10*3/uL (ref 4.0–10.5)
nRBC: 0.6 % — ABNORMAL HIGH (ref 0.0–0.2)

## 2020-07-12 LAB — GLUCOSE, CAPILLARY
Glucose-Capillary: 105 mg/dL — ABNORMAL HIGH (ref 70–99)
Glucose-Capillary: 108 mg/dL — ABNORMAL HIGH (ref 70–99)
Glucose-Capillary: 113 mg/dL — ABNORMAL HIGH (ref 70–99)
Glucose-Capillary: 147 mg/dL — ABNORMAL HIGH (ref 70–99)
Glucose-Capillary: 89 mg/dL (ref 70–99)
Glucose-Capillary: 96 mg/dL (ref 70–99)

## 2020-07-12 MED ORDER — POTASSIUM CHLORIDE 10 MEQ/50ML IV SOLN
10.0000 meq | INTRAVENOUS | Status: AC
  Administered 2020-07-12 (×5): 10 meq via INTRAVENOUS
  Filled 2020-07-12 (×5): qty 50

## 2020-07-12 MED ORDER — METOPROLOL TARTRATE 12.5 MG HALF TABLET
12.5000 mg | ORAL_TABLET | Freq: Two times a day (BID) | ORAL | Status: DC
  Administered 2020-07-12 (×2): 12.5 mg via ORAL
  Filled 2020-07-12 (×2): qty 1

## 2020-07-12 MED ORDER — SENNOSIDES-DOCUSATE SODIUM 8.6-50 MG PO TABS: 1.0000 | ORAL_TABLET | Freq: Two times a day (BID) | ORAL | Status: DC | PRN

## 2020-07-12 MED ORDER — METOLAZONE 2.5 MG PO TABS
2.5000 mg | ORAL_TABLET | Freq: Once | ORAL | Status: AC
  Administered 2020-07-12: 2.5 mg via ORAL
  Filled 2020-07-12: qty 1

## 2020-07-12 MED ORDER — MIDODRINE HCL 5 MG PO TABS
5.0000 mg | ORAL_TABLET | Freq: Two times a day (BID) | ORAL | Status: DC
  Administered 2020-07-12 – 2020-07-13 (×3): 5 mg via ORAL
  Filled 2020-07-12 (×3): qty 1

## 2020-07-12 MED ORDER — FUROSEMIDE 10 MG/ML IJ SOLN
40.0000 mg | Freq: Four times a day (QID) | INTRAMUSCULAR | Status: AC
Start: 2020-07-12 — End: 2020-07-12
  Administered 2020-07-12 (×2): 40 mg via INTRAVENOUS
  Filled 2020-07-12 (×2): qty 4

## 2020-07-12 MED FILL — Lidocaine HCl Local Preservative Free (PF) Inj 2%: INTRAMUSCULAR | Qty: 15 | Status: AC

## 2020-07-12 MED FILL — Heparin Sodium (Porcine) Inj 1000 Unit/ML: INTRAMUSCULAR | Qty: 30 | Status: AC

## 2020-07-12 MED FILL — Potassium Chloride Inj 2 mEq/ML: INTRAVENOUS | Qty: 40 | Status: AC

## 2020-07-12 NOTE — Progress Notes (Signed)
TCTS evening rounds  POD #3 s/p AVR/septal myomectomy No complaints, working with IS BP (!) 127/58   Pulse (!) 102   Temp 98.5 F (36.9 C) (Oral)   Resp (!) 24   Ht 6' (1.829 m)   Wt 99.1 kg   SpO2 95%   BMI 29.63 kg/m  Alert/oriented CTA RRR--mildly tachy C/d/I   Intake/Output Summary (Last 24 hours) at 07/12/2020 1839 Last data filed at 07/12/2020 1821 Gross per 24 hour  Intake 1431.6 ml  Output 4780 ml  Net -3348.4 ml   Good uop A/p: continue pulm management Merrilyn Legler Z. Vickey Sages, MD [phone]

## 2020-07-12 NOTE — Plan of Care (Signed)

## 2020-07-12 NOTE — Progress Notes (Signed)
3 Days Post-Op Procedure(s) (LRB): AORTIC VALVE REPLACEMENT (AVR) USING INSPIRIS AORTIC VALVE AND SEPTAL MYOMECTOMY (N/A) TRANSESOPHAGEAL ECHOCARDIOGRAM (TEE) (N/A) Subjective: No complaints. Had brief confusion overnight but cleared quickly. No agitation and did not need Haldol. Says he has no pain and did not require pain meds overnight.  Objective: Vital signs in last 24 hours: Temp:  [97.8 F (36.6 C)-99.4 F (37.4 C)] 98.2 F (36.8 C) (06/23 0400) Pulse Rate:  [84-105] 99 (06/23 0600) Cardiac Rhythm: Normal sinus rhythm (06/23 0400) Resp:  [11-26] 19 (06/23 0600) BP: (97-147)/(44-104) 120/57 (06/23 0600) SpO2:  [88 %-100 %] 95 % (06/23 0600) Arterial Line BP: (104-188)/(39-63) 147/49 (06/23 0000) FiO2 (%):  [50 %-70 %] 50 % (06/23 0600) Weight:  [99.1 kg] 99.1 kg (06/23 0500)  Hemodynamic parameters for last 24 hours:    Intake/Output from previous day: 06/22 0701 - 06/23 0700 In: 1118.6 [P.O.:50; I.V.:624.2; IV Piggyback:444.4] Out: 5325 [Urine:5325] Intake/Output this shift: No intake/output data recorded.  General appearance: alert and cooperative Neurologic: intact Heart: regular rate and rhythm, S1, S2 normal, no murmur, click, rub or gallop Lungs: diminished breath sounds bibasilar Extremities: edema improved Wound: incision ok  Lab Results: Recent Labs    07/11/20 0251 07/11/20 1246 07/12/20 0315  WBC 12.3*  --  10.5  HGB 8.8* 8.8* 8.5*  HCT 27.0* 26.0* 26.3*  PLT 119*  --  115*   BMET:  Recent Labs    07/11/20 1753 07/12/20 0315  NA 142 142  K 3.7 3.8  CL 106 105  CO2 28 29  GLUCOSE 144* 124*  BUN 18 17  CREATININE 1.18 1.05  CALCIUM 8.5* 8.9    PT/INR:  Recent Labs    07/09/20 1333  LABPROT 16.4*  INR 1.3*   ABG    Component Value Date/Time   PHART 7.515 (H) 07/11/2020 1246   HCO3 28.7 (H) 07/11/2020 1246   TCO2 30 07/11/2020 1246   ACIDBASEDEF 9.0 (H) 07/10/2020 0111   O2SAT 91.0 07/11/2020 1246   CBG (last 3)  Recent  Labs    07/11/20 1625 07/11/20 1927 07/12/20 0314  GLUCAP 145* 135* 105*   CXR: mild interstitial edema, mild bibasilar atelectasis. Small pleural effusions.  Assessment/Plan: S/P Procedure(s) (LRB): AORTIC VALVE REPLACEMENT (AVR) USING INSPIRIS AORTIC VALVE AND SEPTAL MYOMECTOMY (N/A) TRANSESOPHAGEAL ECHOCARDIOGRAM (TEE) (N/A)  POD 3  Hemodynamically stable in sinus rhythm. HR has increased to low 100's so will resume some Lopressor. Midodrine down to 5 bid since BP better. Probably stop tomorrow.   Hypoxemia due to atelectasis and interstitial edema requiring bipap. Will try off bipap as tolerated and work on IS, flutter valve, OOB. Continue diuresis.   Volume excess: -4L yesterday with diuresis. Will continue today. Wt down 12 lbs so now only 18 lbs over preop. Replace K+   Glucose under good control and Levemir and SSI.   Acute postop blood loss anemia: Hgb stable after transfusion yesterday. On iron.  LOS: 3 days    [name] 07/12/2020

## 2020-07-13 ENCOUNTER — Inpatient Hospital Stay (HOSPITAL_COMMUNITY): Payer: Medicare HMO

## 2020-07-13 LAB — BASIC METABOLIC PANEL
Anion gap: 9 (ref 5–15)
BUN: 20 mg/dL (ref 8–23)
CO2: 33 mmol/L — ABNORMAL HIGH (ref 22–32)
Calcium: 9 mg/dL (ref 8.9–10.3)
Chloride: 97 mmol/L — ABNORMAL LOW (ref 98–111)
Creatinine, Ser: 1.23 mg/dL (ref 0.61–1.24)
GFR, Estimated: >60 mL/min (ref >60)
Glucose, Bld: 94 mg/dL (ref 70–99)
Potassium: 3.4 mmol/L — ABNORMAL LOW (ref 3.5–5.1)
Sodium: 139 mmol/L (ref 135–145)

## 2020-07-13 LAB — CBC
HCT: 26.9 % — ABNORMAL LOW (ref 39.0–52.0)
Hemoglobin: 8.5 g/dL — ABNORMAL LOW (ref 13.0–17.0)
MCH: 29.1 pg (ref 26.0–34.0)
MCHC: 31.6 g/dL (ref 30.0–36.0)
MCV: 92.1 fL (ref 80.0–100.0)
Platelets: 134 10*3/uL — ABNORMAL LOW (ref 150–400)
RBC: 2.92 MIL/uL — ABNORMAL LOW (ref 4.22–5.81)
RDW: 14.6 % (ref 11.5–15.5)
WBC: 7.9 10*3/uL (ref 4.0–10.5)
nRBC: 0.6 % — ABNORMAL HIGH (ref 0.0–0.2)

## 2020-07-13 LAB — GLUCOSE, CAPILLARY
Glucose-Capillary: 80 mg/dL (ref 70–99)
Glucose-Capillary: 119 mg/dL — ABNORMAL HIGH (ref 70–99)
Glucose-Capillary: 96 mg/dL (ref 70–99)
Glucose-Capillary: 97 mg/dL (ref 70–99)

## 2020-07-13 MED ORDER — MIDODRINE HCL 5 MG PO TABS
5.0000 mg | ORAL_TABLET | Freq: Three times a day (TID) | ORAL | Status: DC
  Administered 2020-07-13: 5 mg via ORAL
  Filled 2020-07-13: qty 1

## 2020-07-13 MED ORDER — AMIODARONE HCL IN DEXTROSE 360-4.14 MG/200ML-% IV SOLN
60.0000 mg/h | INTRAVENOUS | Status: AC
  Administered 2020-07-13 (×2): 60 mg/h via INTRAVENOUS

## 2020-07-13 MED ORDER — MIDODRINE HCL 5 MG PO TABS
10.0000 mg | ORAL_TABLET | Freq: Three times a day (TID) | ORAL | Status: DC
  Administered 2020-07-14 (×3): 10 mg via ORAL
  Filled 2020-07-13 (×4): qty 2

## 2020-07-13 MED ORDER — AMIODARONE HCL IN DEXTROSE 360-4.14 MG/200ML-% IV SOLN
30.0000 mg/h | INTRAVENOUS | Status: DC
  Administered 2020-07-13 – 2020-07-15 (×3): 30 mg/h via INTRAVENOUS
  Filled 2020-07-13 (×4): qty 200

## 2020-07-13 MED ORDER — METOPROLOL TARTRATE 25 MG PO TABS
25.0000 mg | ORAL_TABLET | Freq: Two times a day (BID) | ORAL | Status: DC
  Administered 2020-07-13: 25 mg via ORAL
  Filled 2020-07-13: qty 1

## 2020-07-13 MED ORDER — COLCHICINE 0.6 MG PO TABS
0.6000 mg | ORAL_TABLET | Freq: Two times a day (BID) | ORAL | Status: DC
  Administered 2020-07-13 – 2020-07-17 (×8): 0.6 mg via ORAL
  Filled 2020-07-13 (×8): qty 1

## 2020-07-13 MED ORDER — AMIODARONE HCL IN DEXTROSE 360-4.14 MG/200ML-% IV SOLN
INTRAVENOUS | Status: AC
  Filled 2020-07-13: qty 200

## 2020-07-13 MED ORDER — ALBUMIN HUMAN 5 % IV SOLN
12.5000 g | Freq: Once | INTRAVENOUS | Status: AC
  Filled 2020-07-13: qty 250

## 2020-07-13 MED ORDER — POTASSIUM CHLORIDE CRYS ER 20 MEQ PO TBCR
40.0000 meq | EXTENDED_RELEASE_TABLET | Freq: Two times a day (BID) | ORAL | Status: AC
  Administered 2020-07-13 (×2): 40 meq via ORAL
  Filled 2020-07-13 (×2): qty 2

## 2020-07-13 MED ORDER — AMIODARONE LOAD VIA INFUSION
150.0000 mg | Freq: Once | INTRAVENOUS | Status: AC
  Administered 2020-07-13: 150 mg via INTRAVENOUS

## 2020-07-13 NOTE — Significant Event (Signed)
Patient went into A Fib at 0950am, HR rates as high as 170s. Patient is asymptomatic; resting in bed, A/O X 4. Called to Dr. Laneta Simmers and received telephone orders for Amiodarone bolus and infusions per protocol. Received order to hold off on transferring patient.   At 1058am-patient converted to Sinus, rates in the 80-90s.     Charles Bamberg, RN

## 2020-07-13 NOTE — Progress Notes (Signed)
Patient ID: Charles [name], male   DOB: [birth year], 75 y.o.   MRN: [id number] TCTS Evening Rounds  He has been running a lower BP at times today and got dizzy when up to ambulate with orthostasis into the 70's. Gave him some albumin. Will hold off on Lopressor and increase midodrine back to 10 tid.   Sats 92% on 6L HFNC  Alert and oriented. Says he feels well. Ate dinner.  He has some swelling over right 1st MCP joint. It is a little red and looks like gout although he says he has never had it before. Will start colchicine.

## 2020-07-13 NOTE — Progress Notes (Signed)
4 Days Post-Op Procedure(s) (LRB): AORTIC VALVE REPLACEMENT (AVR) USING INSPIRIS AORTIC VALVE AND SEPTAL MYOMECTOMY (N/A) TRANSESOPHAGEAL ECHOCARDIOGRAM (TEE) (N/A) Subjective: No complaints. No pain. Sitting up eating breakfast.  Objective: Vital signs in last 24 hours: Temp:  [97.7 F (36.5 C)-100 F (37.8 C)] 99.1 F (37.3 C) (06/24 0647) Pulse Rate:  [88-107] 104 (06/24 0700) Cardiac Rhythm: Normal sinus rhythm (06/23 2000) Resp:  [16-26] 16 (06/24 0647) BP: (93-142)/(49-74) 120/64 (06/24 0400) SpO2:  [88 %-99 %] 91 % (06/24 0700) FiO2 (%):  [50 %-60 %] 50 % (06/23 2214) Weight:  [96.8 kg] 96.8 kg (06/24 0600)  Hemodynamic parameters for last 24 hours:    Intake/Output from previous day: 06/23 0701 - 06/24 0700 In: 1262.7 [P.O.:970; I.V.:29.1; IV Piggyback:263.6] Out: 4455 [Urine:4455] Intake/Output this shift: No intake/output data recorded.  General appearance: alert and cooperative Neurologic: intact Heart: regular rate and rhythm, S1, S2 normal, no murmur, click, rub or gallop Lungs: diminished breath sounds bibasilar Extremities: edema mild Wound: incision ok  Lab Results: Recent Labs    07/12/20 0315 07/13/20 0340  WBC 10.5 7.9  HGB 8.5* 8.5*  HCT 26.3* 26.9*  PLT 115* 134*   BMET:  Recent Labs    07/12/20 1725 07/13/20 0340  NA 139 139  K 3.4* 3.4*  CL 101 97*  CO2 29 33*  GLUCOSE 120* 94  BUN 20 20  CREATININE 1.13 1.23  CALCIUM 8.9 9.0    PT/INR: No results for input(s): LABPROT, INR in the last 72 hours. ABG    Component Value Date/Time   PHART 7.515 (H) 07/11/2020 1246   HCO3 28.7 (H) 07/11/2020 1246   TCO2 30 07/11/2020 1246   ACIDBASEDEF 9.0 (H) 07/10/2020 0111   O2SAT 91.0 07/11/2020 1246   CBG (last 3)  Recent Labs    07/12/20 2011 07/12/20 2338 07/13/20 0646  GLUCAP 147* 96 80   CXR: mild bibasilar atelectasis and interstitial edema  Assessment/Plan: S/P Procedure(s) (LRB): AORTIC VALVE REPLACEMENT (AVR) USING  INSPIRIS AORTIC VALVE AND SEPTAL MYOMECTOMY (N/A) TRANSESOPHAGEAL ECHOCARDIOGRAM (TEE) (N/A)  POD 4 Hemodynamically stable in sinus rhythm. HR still low 100's so will increase Lopressor to 25 bid. Midodrine down to 5 bid since BP better. Plan to stop tomorrow if BP stable today.   Hypoxemia due to atelectasis and interstitial edema requiring bipap. Remained off bipap on 50% HFNC overnight and slept well. Work on IS, flutter valve, OOB. Continue diuresis.   Volume excess: -3L yesterday with diuresis. Will continue diuresis but switch to oral and slow down. Wt down 5 lbs so now only 13 lbs over preop. Replace K+   Glucose under good control. DC Levemir and continue CBG's and SSI today. He did not have a Hgb A1c preop. No hx of DM.   Acute postop blood loss anemia: Hgb stable after transfusion yesterday. On iron.  Transfer to 4E and continue mobilization.   LOS: 4 days    [name] 07/13/2020

## 2020-07-13 NOTE — Progress Notes (Signed)
CARDIAC REHAB PHASE I   PRE:  Rate/Rhythm: 77 SR  BP:  Sitting: 70/63 ---> 75/50      SaO2: 98 6L  MODE:  Ambulation: 5 ft   POST:  Rate/Rhythm: 82 SR  BP:  Sitting: 60/50 --> 118/55    SaO2: 98 6L   Pt agreeable and eager to ambulate, states he "feels good" today. BP check BL, RN aware, pt denied dizziness. Pt ax1 to rise, continued to deny dizziness. Walked about 68ft with EVA and gait belt, and pt with LOB. Pt helped sit into recliner. BP rechecked, RN at bedside. Pts legs elevated, noted rise in BP. Encouraged pt to rest and try to ambulate later this evening if BP better. Will continue to follow.  [id number] Reynold Bowen, RN BSN 07/13/2020 3:03 PM

## 2020-07-14 LAB — BASIC METABOLIC PANEL
Anion gap: 11 (ref 5–15)
BUN: 27 mg/dL — ABNORMAL HIGH (ref 8–23)
CO2: 27 mmol/L (ref 22–32)
Calcium: 8.9 mg/dL (ref 8.9–10.3)
Chloride: 93 mmol/L — ABNORMAL LOW (ref 98–111)
Creatinine, Ser: 1.39 mg/dL — ABNORMAL HIGH (ref 0.61–1.24)
GFR, Estimated: 53 mL/min — ABNORMAL LOW (ref >60)
Glucose, Bld: 160 mg/dL — ABNORMAL HIGH (ref 70–99)
Potassium: 3.3 mmol/L — ABNORMAL LOW (ref 3.5–5.1)
Sodium: 131 mmol/L — ABNORMAL LOW (ref 135–145)

## 2020-07-14 LAB — CBC
HCT: 26.6 % — ABNORMAL LOW (ref 39.0–52.0)
Hemoglobin: 8.7 g/dL — ABNORMAL LOW (ref 13.0–17.0)
MCH: 29.6 pg (ref 26.0–34.0)
MCHC: 32.7 g/dL (ref 30.0–36.0)
MCV: 90.5 fL (ref 80.0–100.0)
Platelets: 148 10*3/uL — ABNORMAL LOW (ref 150–400)
RBC: 2.94 MIL/uL — ABNORMAL LOW (ref 4.22–5.81)
RDW: 14.2 % (ref 11.5–15.5)
WBC: 5.7 10*3/uL (ref 4.0–10.5)
nRBC: 0.9 % — ABNORMAL HIGH (ref 0.0–0.2)

## 2020-07-14 LAB — GLUCOSE, CAPILLARY: Glucose-Capillary: 118 mg/dL — ABNORMAL HIGH (ref 70–99)

## 2020-07-14 MED ORDER — OXYCODONE HCL 5 MG PO TABS
5.0000 mg | ORAL_TABLET | ORAL | Status: DC | PRN
  Administered 2020-07-14: 5 mg via ORAL
  Administered 2020-07-15: 10 mg via ORAL
  Administered 2020-07-15 (×2): 5 mg via ORAL
  Filled 2020-07-14 (×2): qty 1
  Filled 2020-07-14: qty 2
  Filled 2020-07-14: qty 1

## 2020-07-14 MED ORDER — ASPIRIN EC 81 MG PO TBEC
81.0000 mg | DELAYED_RELEASE_TABLET | Freq: Every day | ORAL | Status: DC
  Administered 2020-07-14 – 2020-07-17 (×4): 81 mg via ORAL
  Filled 2020-07-14 (×4): qty 1

## 2020-07-14 MED ORDER — SODIUM CHLORIDE 0.9% FLUSH
3.0000 mL | Freq: Two times a day (BID) | INTRAVENOUS | Status: DC
  Administered 2020-07-14 – 2020-07-15 (×3): 3 mL via INTRAVENOUS

## 2020-07-14 MED ORDER — INSULIN ASPART 100 UNIT/ML IJ SOLN: 0.0000 [IU] | Freq: Three times a day (TID) | INTRAMUSCULAR | Status: DC

## 2020-07-14 MED ORDER — PANTOPRAZOLE SODIUM 40 MG PO TBEC
40.0000 mg | DELAYED_RELEASE_TABLET | Freq: Every day | ORAL | Status: DC
  Administered 2020-07-14 – 2020-07-15 (×2): 40 mg via ORAL
  Filled 2020-07-14 (×2): qty 1

## 2020-07-14 MED ORDER — POTASSIUM CHLORIDE CRYS ER 20 MEQ PO TBCR: 20.0000 meq | EXTENDED_RELEASE_TABLET | Freq: Two times a day (BID) | ORAL | Status: DC

## 2020-07-14 MED ORDER — AMIODARONE LOAD VIA INFUSION
150.0000 mg | Freq: Once | INTRAVENOUS | Status: AC
  Administered 2020-07-14: 150 mg via INTRAVENOUS
  Filled 2020-07-14: qty 83.34

## 2020-07-14 MED ORDER — ONDANSETRON HCL 4 MG PO TABS: 4.0000 mg | ORAL_TABLET | Freq: Four times a day (QID) | ORAL | Status: DC | PRN

## 2020-07-14 MED ORDER — POTASSIUM CHLORIDE CRYS ER 20 MEQ PO TBCR
40.0000 meq | EXTENDED_RELEASE_TABLET | Freq: Once | ORAL | Status: AC
  Administered 2020-07-14: 40 meq via ORAL
  Filled 2020-07-14: qty 2

## 2020-07-14 MED ORDER — ~~LOC~~ CARDIAC SURGERY, PATIENT & FAMILY EDUCATION: Freq: Once | Status: AC

## 2020-07-14 MED ORDER — FUROSEMIDE 40 MG PO TABS: 40.0000 mg | ORAL_TABLET | Freq: Every day | ORAL | Status: DC

## 2020-07-14 MED ORDER — AMIODARONE IV BOLUS ONLY 150 MG/100ML: 150.0000 mg | Freq: Once | INTRAVENOUS | Status: DC

## 2020-07-14 MED ORDER — SODIUM CHLORIDE 0.9% FLUSH: 3.0000 mL | INTRAVENOUS | Status: DC | PRN

## 2020-07-14 MED ORDER — ASPIRIN EC 325 MG PO TBEC: 325.0000 mg | DELAYED_RELEASE_TABLET | Freq: Every day | ORAL | Status: DC

## 2020-07-14 MED ORDER — ONDANSETRON HCL 4 MG/2ML IJ SOLN: 4.0000 mg | Freq: Four times a day (QID) | INTRAMUSCULAR | Status: DC | PRN

## 2020-07-14 MED ORDER — ALBUMIN HUMAN 5 % IV SOLN
INTRAVENOUS | Status: AC
  Administered 2020-07-14: 12.5 g via INTRAVENOUS
  Filled 2020-07-14: qty 250

## 2020-07-14 MED ORDER — AMIODARONE IV BOLUS ONLY 150 MG/100ML
150.0000 mg | Freq: Once | INTRAVENOUS | Status: AC
  Administered 2020-07-14: 150 mg via INTRAVENOUS
  Filled 2020-07-14: qty 100

## 2020-07-14 MED ORDER — APIXABAN 5 MG PO TABS
5.0000 mg | ORAL_TABLET | Freq: Two times a day (BID) | ORAL | Status: DC
  Administered 2020-07-14 – 2020-07-17 (×6): 5 mg via ORAL
  Filled 2020-07-14 (×7): qty 1

## 2020-07-14 MED ORDER — SODIUM CHLORIDE 0.9 % IV SOLN: 250.0000 mL | INTRAVENOUS | Status: DC | PRN

## 2020-07-14 MED ORDER — CHLORHEXIDINE GLUCONATE CLOTH 2 % EX PADS
6.0000 | MEDICATED_PAD | Freq: Every day | CUTANEOUS | Status: DC
  Administered 2020-07-14 – 2020-07-15 (×2): 6 via TOPICAL

## 2020-07-14 MED ORDER — MIDODRINE HCL 5 MG PO TABS
10.0000 mg | ORAL_TABLET | Freq: Three times a day (TID) | ORAL | Status: AC
  Administered 2020-07-14: 10 mg via ORAL
  Filled 2020-07-14: qty 2

## 2020-07-14 NOTE — Progress Notes (Signed)
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 Slept well. Ambulated this am without dizziness.  Objective: Vital signs in last 24 hours: Temp:  [98.1 F (36.7 C)-98.7 F (37.1 C)] 98.1 F (36.7 C) (06/25 0635) Pulse Rate:  [38-151] 110 (06/25 0700) Cardiac Rhythm: A-V Sequential paced (06/25 0400) Resp:  [12-26] 18 (06/25 0700) BP: (82-147)/(45-90) 100/45 (06/25 0700) SpO2:  [89 %-100 %] 95 % (06/25 0700) Weight:  [98 kg] 98 kg (06/25 0200)  Hemodynamic parameters for last 24 hours:    Intake/Output from previous day: 06/24 0701 - 06/25 0700 In: 1074.6 [P.O.:360; I.V.:488.5; IV Piggyback:226.1] Out: 470 [Urine:470] Intake/Output this shift: No intake/output data recorded.  General appearance: alert and cooperative Neurologic: intact Heart: irregularly irregular rhythm and 2/6 systolic murmur LLSB Lungs: diminished breath sounds bibasilar Extremities: edema mild. Right hand remains swollen and tender particularly over 1st mcp joint. Wound: chest incision healing well.  Lab Results: Recent Labs    07/12/20 0315 07/13/20 0340  WBC 10.5 7.9  HGB 8.5* 8.5*  HCT 26.3* 26.9*  PLT 115* 134*   BMET:  Recent Labs    07/12/20 1725 07/13/20 0340  NA 139 139  K 3.4* 3.4*  CL 101 97*  CO2 29 33*  GLUCOSE 120* 94  BUN 20 20  CREATININE 1.13 1.23  CALCIUM 8.9 9.0    PT/INR: No results for input(s): LABPROT, INR in the last 72 hours. ABG    Component Value Date/Time   PHART 7.515 (H) 07/11/2020 1246   HCO3 28.7 (H) 07/11/2020 1246   TCO2 30 07/11/2020 1246   ACIDBASEDEF 9.0 (H) 07/10/2020 0111   O2SAT 91.0 07/11/2020 1246   CBG (last 3)  Recent Labs    07/13/20 1509 07/13/20 2212 07/14/20 0631  GLUCAP 96 97 118*    Assessment/Plan: S/P Procedure(s) (LRB): AORTIC VALVE REPLACEMENT (AVR) USING INSPIRIS  AORTIC VALVE AND SEPTAL MYOMECTOMY (N/A) TRANSESOPHAGEAL ECHOCARDIOGRAM (TEE) (N/A)  POD 5  Hemodynamically stable in atrial fibrillation 110 on amio drip. Will give another bolus this am. He received a bolus overnight. I think he will need to get on Eliquis after pacing wires out. I think we can DC pacer wires this am and start Eliquis later today.   Hypoxemia due to atelectasis and interstitial edema requiring bipap. He is doing well on HFNC 8L. Work on [employer], OOB, ambulation.     Volume excess: 16 lbs over preop. Continue gentle diuresis. Replace K+   Glucose under good control. DC CBG's and SSI. He did not have a Hgb A1c preop. No hx of DM.   Acute postop blood loss anemia: Hgb stable after transfusion. On iron.     LOS: 5 days    [name] 07/14/2020

## 2020-07-14 NOTE — Progress Notes (Signed)
Patient ID: Charles [name], male   DOB: [birth year], 75 y.o.   MRN: [id number] TCTS Evening Rounds:  He had a good day. Hemodynamically stable. Back in sinus rhythm on IV amio. Urine output has been good.

## 2020-07-15 LAB — BASIC METABOLIC PANEL
Anion gap: 10 (ref 5–15)
BUN: 22 mg/dL (ref 8–23)
CO2: 28 mmol/L (ref 22–32)
Calcium: 9 mg/dL (ref 8.9–10.3)
Chloride: 96 mmol/L — ABNORMAL LOW (ref 98–111)
Creatinine, Ser: 1.27 mg/dL — ABNORMAL HIGH (ref 0.61–1.24)
GFR, Estimated: 59 mL/min — ABNORMAL LOW (ref >60)
Glucose, Bld: 116 mg/dL — ABNORMAL HIGH (ref 70–99)
Potassium: 3.9 mmol/L (ref 3.5–5.1)
Sodium: 134 mmol/L — ABNORMAL LOW (ref 135–145)

## 2020-07-15 LAB — MAGNESIUM: Magnesium: 2.2 mg/dL (ref 1.7–2.4)

## 2020-07-15 MED ORDER — PANTOPRAZOLE SODIUM 40 MG PO TBEC
40.0000 mg | DELAYED_RELEASE_TABLET | Freq: Every day | ORAL | Status: DC
  Administered 2020-07-16 – 2020-07-17 (×2): 40 mg via ORAL
  Filled 2020-07-15 (×2): qty 1

## 2020-07-15 MED ORDER — AMIODARONE HCL 200 MG PO TABS
400.0000 mg | ORAL_TABLET | Freq: Two times a day (BID) | ORAL | Status: DC
  Administered 2020-07-15 – 2020-07-17 (×5): 400 mg via ORAL
  Filled 2020-07-15 (×5): qty 2

## 2020-07-15 MED ORDER — ACETAMINOPHEN 325 MG PO TABS
650.0000 mg | ORAL_TABLET | Freq: Four times a day (QID) | ORAL | Status: DC | PRN
  Administered 2020-07-16: 650 mg via ORAL
  Filled 2020-07-15: qty 2

## 2020-07-15 MED ORDER — SODIUM CHLORIDE 0.9% FLUSH: 3.0000 mL | INTRAVENOUS | Status: DC | PRN

## 2020-07-15 MED ORDER — ONDANSETRON HCL 4 MG/2ML IJ SOLN: 4.0000 mg | Freq: Four times a day (QID) | INTRAMUSCULAR | Status: DC | PRN

## 2020-07-15 MED ORDER — SODIUM CHLORIDE 0.9% FLUSH
3.0000 mL | Freq: Two times a day (BID) | INTRAVENOUS | Status: DC
  Administered 2020-07-15 – 2020-07-17 (×4): 3 mL via INTRAVENOUS

## 2020-07-15 MED ORDER — POTASSIUM CHLORIDE CRYS ER 20 MEQ PO TBCR
40.0000 meq | EXTENDED_RELEASE_TABLET | Freq: Once | ORAL | Status: AC
  Administered 2020-07-15: 40 meq via ORAL
  Filled 2020-07-15: qty 2

## 2020-07-15 MED ORDER — POTASSIUM CHLORIDE CRYS ER 20 MEQ PO TBCR
20.0000 meq | EXTENDED_RELEASE_TABLET | Freq: Two times a day (BID) | ORAL | Status: DC
  Administered 2020-07-16 – 2020-07-17 (×3): 20 meq via ORAL
  Filled 2020-07-15 (×3): qty 1

## 2020-07-15 MED ORDER — FUROSEMIDE 40 MG PO TABS
40.0000 mg | ORAL_TABLET | Freq: Every day | ORAL | Status: DC
  Administered 2020-07-16 – 2020-07-17 (×2): 40 mg via ORAL
  Filled 2020-07-15 (×2): qty 1

## 2020-07-15 MED ORDER — ONDANSETRON HCL 4 MG PO TABS: 4.0000 mg | ORAL_TABLET | Freq: Four times a day (QID) | ORAL | Status: DC | PRN

## 2020-07-15 MED ORDER — SODIUM CHLORIDE 0.9 % IV SOLN: 250.0000 mL | INTRAVENOUS | Status: DC | PRN

## 2020-07-15 MED ORDER — ~~LOC~~ CARDIAC SURGERY, PATIENT & FAMILY EDUCATION: Freq: Once | Status: DC

## 2020-07-15 MED ORDER — MIDODRINE HCL 5 MG PO TABS
5.0000 mg | ORAL_TABLET | Freq: Three times a day (TID) | ORAL | Status: DC
  Administered 2020-07-15 (×3): 5 mg via ORAL
  Filled 2020-07-15 (×2): qty 1

## 2020-07-15 MED ORDER — OXYCODONE HCL 5 MG PO TABS
5.0000 mg | ORAL_TABLET | ORAL | Status: DC | PRN
  Administered 2020-07-16 – 2020-07-17 (×6): 10 mg via ORAL
  Filled 2020-07-15 (×6): qty 2

## 2020-07-15 NOTE — Progress Notes (Signed)
Mobility Specialist: Progress Note   07/15/20 1532  Mobility  Activity Ambulated in hall  Level of Assistance Minimal assist, patient does 75% or more  Assistive Device Front wheel walker  Distance Ambulated (ft) 260 ft  Mobility Ambulated with assistance in hallway  Mobility Response Tolerated well  Mobility performed by Mobility specialist  $Mobility charge 1 Mobility   Pre-Mobility on RA: 95 HR, 88% SpO2 During Mobility on 1L/min [place]: 107 HR, 92% SpO2 Post-Mobility on 1L/min [place]: 105 HR, 190/66 > 140/63 BP, 92% SpO2  Pt sats reading 88% upon entering the room on RA. Sats increased to 92% on 1 L/min [place]. Pt asx throughout ambulation and is sitting EOB with call bell at his side. RN notified of elevated BP.   [facility] [place] Mobility Specialist Mobility Specialist Phone: [phone]

## 2020-07-15 NOTE — Progress Notes (Signed)
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 Ambulated this am. Slept fairly well.  Objective: Vital signs in last 24 hours: Temp:  [98.1 F (36.7 C)-98.8 F (37.1 C)] 98.1 F (36.7 C) (06/26 0756) Pulse Rate:  [72-132] 88 (06/26 0500) Cardiac Rhythm: Normal sinus rhythm (06/26 0400) Resp:  [14-27] 19 (06/26 0500) BP: (81-153)/(36-79) 153/64 (06/26 0500) SpO2:  [86 %-100 %] 92 % (06/26 0500)  Hemodynamic parameters for last 24 hours:    Intake/Output from previous day: 06/25 0701 - 06/26 0700 In: 1053.4 [P.O.:610; I.V.:443.4] Out: 2900 [Urine:2900] Intake/Output this shift: No intake/output data recorded.  General appearance: alert and cooperative Neurologic: intact Heart: regular rate and rhythm, S1, S2 normal, 2/6 systolic murmur LLSB Lungs: clear to auscultation bilaterally Extremities: edema mild Wound: incision ok  Lab Results: Recent Labs    07/13/20 0340 07/14/20 0847  WBC 7.9 5.7  HGB 8.5* 8.7*  HCT 26.9* 26.6*  PLT 134* 148*   BMET:  Recent Labs    07/14/20 0847 07/15/20 0044  NA 131* 134*  K 3.3* 3.9  CL 93* 96*  CO2 27 28  GLUCOSE 160* 116*  BUN 27* 22  CREATININE 1.39* 1.27*  CALCIUM 8.9 9.0    PT/INR: No results for input(s): LABPROT, INR in the last 72 hours. ABG    Component Value Date/Time   PHART 7.515 (H) 07/11/2020 1246   HCO3 28.7 (H) 07/11/2020 1246   TCO2 30 07/11/2020 1246   ACIDBASEDEF 9.0 (H) 07/10/2020 0111   O2SAT 91.0 07/11/2020 1246   CBG (last 3)  Recent Labs    07/13/20 1509 07/13/20 2212 07/14/20 0631  GLUCAP 96 97 118*    Assessment/Plan: S/P Procedure(s) (LRB): AORTIC VALVE REPLACEMENT (AVR) USING INSPIRIS AORTIC VALVE AND SEPTAL MYOMECTOMY (N/A) TRANSESOPHAGEAL ECHOCARDIOGRAM (TEE) (N/A)  POD 6  Hemodynamically stable in sinus rhythm on IV  amio. Converted yesterday. Switch to oral amiodarone. Eliquis started yesterday.    Hypoxemia due to atelectasis and interstitial edema requiring bipap. He is doing well on HFNC 5L and sats 98%. Wean oxygen as tolerated.  Work on [employer], OOB, ambulation.     Volume excess: No wt today. He was -1800 cc yesterday on po lasix.  Continue gentle diuresis. Replace K+   Acute postop blood loss anemia: Hgb stable after transfusion. On iron.  Transfer to 4E and continue mobilization.   LOS: 6 days    Charles [name] 07/15/2020

## 2020-07-16 ENCOUNTER — Inpatient Hospital Stay (HOSPITAL_COMMUNITY): Payer: Medicare HMO

## 2020-07-16 ENCOUNTER — Other Ambulatory Visit (HOSPITAL_COMMUNITY): Payer: Self-pay

## 2020-07-16 LAB — BASIC METABOLIC PANEL
Anion gap: 9 (ref 5–15)
BUN: 15 mg/dL (ref 8–23)
CO2: 27 mmol/L (ref 22–32)
Calcium: 9.2 mg/dL (ref 8.9–10.3)
Chloride: 98 mmol/L (ref 98–111)
Creatinine, Ser: 1.1 mg/dL (ref 0.61–1.24)
GFR, Estimated: >60 mL/min (ref >60)
Glucose, Bld: 106 mg/dL — ABNORMAL HIGH (ref 70–99)
Potassium: 4.4 mmol/L (ref 3.5–5.1)
Sodium: 134 mmol/L — ABNORMAL LOW (ref 135–145)

## 2020-07-16 NOTE — Care Management Important Message (Signed)
Important Message  Patient Details  Name: Charles [name] MRN: [id number] Date of Birth: Dec 25, 1945   Medicare Important Message Given:  Yes     Dorena Bodo 07/16/2020, 3:07 PM

## 2020-07-16 NOTE — Discharge Instructions (Addendum)
Discharge Instructions:  1. You may shower, please wash incisions daily with soap and water and keep dry.  If you wish to cover wounds with dressing you may do so but please keep clean and change daily.  No tub baths or swimming until incisions have completely healed.  If your incisions become red or develop any drainage please call our office at [phone]  2. No Driving until cleared by Dr Bartle's office and you are no longer using narcotic pain medications  3. Monitor your weight daily.. Please use the same scale and weigh at same time... If you gain 5-10 lbs in 48 hours with associated lower extremity swelling, please contact our office at [phone]  4. Fever of 101.5 for at least 24 hours with no source, please contact our office at [phone]  5. Activity- up as tolerated, please walk at least 3 times per day.  Avoid strenuous activity, no lifting, pushing, or pulling with your arms over 8-10 lbs for a minimum of 6 weeks  6. If any questions or concerns arise, please do not hesitate to contact our office at [phone]  Information on my medicine - ELIQUIS (apixaban)  Why was Eliquis prescribed for you? Eliquis was prescribed for you to reduce the risk of a blood clot forming that can cause a stroke if you have a medical condition called atrial fibrillation (a type of irregular heartbeat).  What do You need to know about Eliquis ? Take your Eliquis TWICE DAILY - one tablet in the morning and one tablet in the evening with or without food. If you have difficulty swallowing the tablet whole please discuss with your pharmacist how to take the medication safely.  Take Eliquis exactly as prescribed by your doctor and DO NOT stop taking Eliquis without talking to the doctor who prescribed the medication.  Stopping may increase your risk of developing a stroke.  Refill your prescription before you run out.  After discharge, you should have regular check-up appointments with  your healthcare provider that is prescribing your Eliquis.  In the future your dose may need to be changed if your kidney function or weight changes by a significant amount or as you get older.  What do you do if you miss a dose? If you miss a dose, take it as soon as you remember on the same day and resume taking twice daily.  Do not take more than one dose of ELIQUIS at the same time to make up a missed dose.  Important Safety Information A possible side effect of Eliquis is bleeding. You should call your healthcare provider right away if you experience any of the following: Bleeding from an injury or your nose that does not stop. Unusual colored urine (red or dark brown) or unusual colored stools (red or black). Unusual bruising for unknown reasons. A serious fall or if you hit your head (even if there is no bleeding).  Some medicines may interact with Eliquis and might increase your risk of bleeding or clotting while on Eliquis. To help avoid this, consult your healthcare provider or pharmacist prior to using any new prescription or non-prescription medications, including herbals, vitamins, non-steroidal anti-inflammatory drugs (NSAIDs) and supplements.  This website has more information on Eliquis (apixaban): http://www.eliquis.com/eliquis/home  

## 2020-07-16 NOTE — Progress Notes (Signed)
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 Ambulating. Bowels moved yesterday.  Objective: Vital signs in last 24 hours: Temp:  [98 F (36.7 C)-99 F (37.2 C)] 98 F (36.7 C) (06/27 0442) Pulse Rate:  [77-100] 100 (06/27 0442) Cardiac Rhythm: Normal sinus rhythm;Bundle branch block (06/26 1900) Resp:  [11-27] 17 (06/27 0442) BP: (93-145)/(51-85) 145/73 (06/27 0442) SpO2:  [90 %-98 %] 94 % (06/27 0442) Weight:  [96.3 kg] 96.3 kg (06/27 0514)  Hemodynamic parameters for last 24 hours:    Intake/Output from previous day: 06/26 0701 - 06/27 0700 In: 86 [I.V.:86] Out: 1600 [Urine:1600] Intake/Output this shift: No intake/output data recorded.  General appearance: alert and cooperative Neurologic: intact Heart: regular rate and rhythm and 2/6 systolic murmur LLSB Lungs: clear to auscultation bilaterally Extremities: no edema in legs. Still has some swelling and tenderness of right 1st mcp joint. Mild superficial phlebitis right antecubital area related to amiodarone infusion.  Wound: incision healing well.  Lab Results: Recent Labs    07/14/20 0847  WBC 5.7  HGB 8.7*  HCT 26.6*  PLT 148*   BMET:  Recent Labs    07/15/20 0044 07/16/20 0121  NA 134* 134*  K 3.9 4.4  CL 96* 98  CO2 28 27  GLUCOSE 116* 106*  BUN 22 15  CREATININE 1.27* 1.10  CALCIUM 9.0 9.2    PT/INR: No results for input(s): LABPROT, INR in the last 72 hours. ABG    Component Value Date/Time   PHART 7.515 (H) 07/11/2020 1246   HCO3 28.7 (H) 07/11/2020 1246   TCO2 30 07/11/2020 1246   ACIDBASEDEF 9.0 (H) 07/10/2020 0111   O2SAT 91.0 07/11/2020 1246   CBG (last 3)  Recent Labs    07/13/20 1509 07/13/20 2212 07/14/20 0631  GLUCAP 96 97 118*    Assessment/Plan: S/P Procedure(s) (LRB): AORTIC VALVE REPLACEMENT (AVR) USING INSPIRIS  AORTIC VALVE AND SEPTAL MYOMECTOMY (N/A) TRANSESOPHAGEAL ECHOCARDIOGRAM (TEE) (N/A)  POD 7 Hemodynamically stable in sinus rhythm on po amio. No atrial fib yesterday. Holding off on beta blocker until stable off midodrine. Will stop midodrine today.  Volume excess: continues to diurese. Wt down 4 lbs from yesterday. Still about 12 lbs over preop.  Continue IS, ambulation.  Right hand still swollen despite colchicine for suspected gout. Will xray.  He should be ready to go home in the next couple days. Would like to make sure he is stable off midodrine. He lives 1 1/2 hrs away.   LOS: 7 days    Charles [name] 07/16/2020

## 2020-07-16 NOTE — Progress Notes (Signed)
Mobility Specialist: Progress Note   07/16/20 1208  Mobility  Activity Ambulated in hall  Level of Assistance Contact guard assist, steadying assist  Assistive Device Front wheel walker  Distance Ambulated (ft) 360 ft  Mobility Ambulated with assistance in hallway  Mobility Response Tolerated well  Mobility performed by Mobility specialist  $Mobility charge 1 Mobility   Post-Mobility: 100 HR, 173/63 BP, 96% SpO2  Pt c/o feeling a little dizzy upon standing, resolved quickly. Pt also c/o chest pain, no rating given but pt said it was minimal. Pt is sitting EOB with call bell at his side.   [facility] Kynli Chou Mobility Specialist Mobility Specialist Phone: [phone]

## 2020-07-16 NOTE — TOC Benefit Eligibility Note (Signed)
Patient Product/process development scientist completed.    The patient is currently admitted and upon discharge could be taking Eliquis 5 mg.  The current 30 day co-pay is, $15.66.   The patient is insured through [employer] Medicare Part D    Roland Earl, CPhT Pharmacy Patient Advocate Specialist [facility] Antimicrobial Stewardship Team Direct Number: [phone]  Fax: [phone]

## 2020-07-16 NOTE — Progress Notes (Signed)
CARDIAC REHAB PHASE I   PRE:  Rate/Rhythm: 90 SR  BP:  Sitting: 118/56      SaO2: 91 RA  MODE:  Ambulation: 310 ft   POST:  Rate/Rhythm: 105 ST with PVCs  BP:  Sitting: 176/74    SaO2: 95 RA   Pt agreeable to ambulate. Noted some dizziness upon rising, but resolved quickly. Pt ambulated 368ft in hallway assist of one with front wheel walker. Pt glad with his progress. Pt returned to edge of bed. Encouraged continued IS use and ambulation. Pt denies any DME needs. Will continue to follow.  [id number] Reynold Bowen, RN BSN 07/16/2020 10:02 AM

## 2020-07-17 ENCOUNTER — Other Ambulatory Visit (HOSPITAL_COMMUNITY): Payer: Self-pay

## 2020-07-17 MED ORDER — AMIODARONE HCL 200 MG PO TABS
400.0000 mg | ORAL_TABLET | Freq: Two times a day (BID) | ORAL | 1 refills | Status: AC
  Filled 2020-07-17: qty 60, 25d supply, fill #0

## 2020-07-17 MED ORDER — METOPROLOL TARTRATE 25 MG PO TABS
25.0000 mg | ORAL_TABLET | Freq: Two times a day (BID) | ORAL | 2 refills | Status: AC
  Filled 2020-07-17: qty 60, 30d supply, fill #0

## 2020-07-17 MED ORDER — POTASSIUM CHLORIDE CRYS ER 20 MEQ PO TBCR
20.0000 meq | EXTENDED_RELEASE_TABLET | Freq: Every day | ORAL | 0 refills | Status: AC
  Filled 2020-07-17: qty 7, 7d supply, fill #0

## 2020-07-17 MED ORDER — OXYCODONE HCL 5 MG PO TABS
5.0000 mg | ORAL_TABLET | ORAL | 0 refills | Status: AC | PRN
  Filled 2020-07-17: qty 20, 5d supply, fill #0

## 2020-07-17 MED ORDER — METOPROLOL TARTRATE 12.5 MG HALF TABLET
12.5000 mg | ORAL_TABLET | Freq: Two times a day (BID) | ORAL | Status: DC
  Administered 2020-07-17: 12.5 mg via ORAL
  Filled 2020-07-17: qty 1

## 2020-07-17 MED ORDER — ACETAMINOPHEN 325 MG PO TABS: 650.0000 mg | ORAL_TABLET | Freq: Four times a day (QID) | ORAL | Status: AC | PRN

## 2020-07-17 MED ORDER — FUROSEMIDE 40 MG PO TABS
40.0000 mg | ORAL_TABLET | Freq: Every day | ORAL | 0 refills | Status: AC
  Filled 2020-07-17: qty 7, 7d supply, fill #0

## 2020-07-17 MED ORDER — APIXABAN 5 MG PO TABS
5.0000 mg | ORAL_TABLET | Freq: Two times a day (BID) | ORAL | 3 refills | Status: AC
  Filled 2020-07-17: qty 60, 30d supply, fill #0

## 2020-07-17 NOTE — Progress Notes (Signed)
Pt being D/C, education provided, IV removed, VSS, telebox returned.   Kalman Jewels, RN 07/17/2020 11:09 AM

## 2020-07-17 NOTE — Progress Notes (Signed)
8 Days Post-Op Procedure(s) (LRB): AORTIC VALVE REPLACEMENT (AVR) USING INSPIRIS AORTIC VALVE AND SEPTAL MYOMECTOMY (N/A) TRANSESOPHAGEAL ECHOCARDIOGRAM (TEE) (N/A) Subjective: Awake and alert, resting in bed.  He walked in the hall several times yesterday.  No new complaints or concerns. Said he noticed some palpitations last night but I did not see any further atrial fibrillation on monitor review  Objective: Vital signs in last 24 hours: Temp:  [98 F (36.7 C)-99.1 F (37.3 C)] 98.5 F (36.9 C) (06/28 0811) Pulse Rate:  [83-97] 87 (06/28 0811) Cardiac Rhythm: Atrial paced (06/28 0739) Resp:  [16-20] 16 (06/28 0811) BP: (116-173)/(41-67) 116/41 (06/28 0811) SpO2:  [93 %-100 %] 100 % (06/28 0811) Weight:  [96.1 kg] 96.1 kg (06/28 0617)     Intake/Output from previous day: 06/27 0701 - 06/28 0700 In: 736 [P.O.:736] Out: 1800 [Urine:1800] Intake/Output this shift: No intake/output data recorded.  General appearance: alert and cooperative Neurologic: intact Heart: regular rate and rhythm expected soft systolic murmur consistent with prosthetic tissue aortic valve  Lungs: clear to auscultation bilaterally Extremities: no edema in legs.  Right hand x-ray showed multilevel osteoarthritic change, no fracture or spondylolisthesis Wound: The sternal incision is healing well.  Lab Results: Recent Labs    07/14/20 0847  WBC 5.7  HGB 8.7*  HCT 26.6*  PLT 148*    BMET:  Recent Labs    07/15/20 0044 07/16/20 0121  NA 134* 134*  K 3.9 4.4  CL 96* 98  CO2 28 27  GLUCOSE 116* 106*  BUN 22 15  CREATININE 1.27* 1.10  CALCIUM 9.0 9.2     PT/INR: No results for input(s): LABPROT, INR in the last 72 hours. ABG    Component Value Date/Time   PHART 7.515 (H) 07/11/2020 1246   HCO3 28.7 (H) 07/11/2020 1246   TCO2 30 07/11/2020 1246   ACIDBASEDEF 9.0 (H) 07/10/2020 0111   O2SAT 91.0 07/11/2020 1246   CBG (last 3)  No results for input(s): GLUCAP in the last 72  hours.   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 No atrial fib past 48 hours.  Off midodrine since yesterday.  Heart rates have been 75-90 (he has a permanent pacemaker).  Systolic blood pressure has been around 150 overnight, 116/41 this morning.  We will start low-dose beta-blocker today  Volume excess: Net 1 L diuresis yesterday but weight is still about 12 lbs over preop.  Continue oral Lasix.  Continue IS, ambulation.  Right hand swelling, colchicine started for suspected gout.  X-ray is showing changes consistent with osteoarthritis  Disposition-Possible discharge to home later today or tomorrow.     LOS: 8 days    Leary Roca, [place] [phone] 07/17/2020

## 2020-07-17 NOTE — Progress Notes (Signed)
Discharge instructions (including medications) discussed with and copy provided to patient/caregiver 

## 2020-07-17 NOTE — Progress Notes (Signed)
CARDIAC REHAB PHASE I   PRE:  Rate/Rhythm: 72   BP:  Sitting: 118/74      SaO2: 95 RA  MODE:  Ambulation: 350 ft   POST:  Rate/Rhythm: 92   BP:  Sitting: 116/67    SaO2: 98 RA   Pt ambulated 344ft in hallway standby assist with front wheel walker. Pt returned to room. D/c education completed with pt and family. Pt educated on importance of site care and monitoring incisions daily. Encouraged continued IS use, walks, sternal precautions. Pt given in-the-tube sheet along with heart healthy diet. Reviewed restrictions and exercise guidelines. Will refer to CRP II [place].  [id number] Reynold Bowen, RN BSN 07/17/2020 10:32 AM

## 2020-07-20 ENCOUNTER — Telehealth (HOSPITAL_COMMUNITY): Payer: Self-pay

## 2020-07-20 NOTE — Telephone Encounter (Signed)
Per phase I cardiac rehab, fax cardiac rehab referral to [facility] in [place].

## 2020-07-24 ENCOUNTER — Other Ambulatory Visit: Payer: Self-pay | Admitting: Physician Assistant

## 2020-07-24 DIAGNOSIS — Z952 Presence of prosthetic heart valve: Secondary | ICD-10-CM

## 2020-07-24 MED ORDER — OXYCODONE HCL 5 MG PO TABS: 5.0000 mg | ORAL_TABLET | Freq: Three times a day (TID) | ORAL | 0 refills | Status: AC | PRN

## 2020-07-24 NOTE — Progress Notes (Signed)
Pt called requesting additional pain medication.  Oxy IR prescribed: 5mg  po q8hr prn severe pain, #21.

## 2020-07-25 ENCOUNTER — Other Ambulatory Visit: Payer: Self-pay

## 2020-07-25 ENCOUNTER — Ambulatory Visit (INDEPENDENT_AMBULATORY_CARE_PROVIDER_SITE_OTHER): Payer: Self-pay

## 2020-07-25 DIAGNOSIS — Z4802 Encounter for removal of sutures: Secondary | ICD-10-CM

## 2020-07-25 NOTE — Progress Notes (Signed)
Removed 2 sutures from chest tube incision sites with no signs of infection and pt tolerated well.

## 2020-07-26 ENCOUNTER — Other Ambulatory Visit (HOSPITAL_COMMUNITY): Payer: Self-pay

## 2020-08-01 ENCOUNTER — Telehealth (HOSPITAL_COMMUNITY): Payer: Self-pay | Admitting: Pharmacist

## 2020-08-01 NOTE — Telephone Encounter (Signed)
Second attempt to reach patient.

## 2020-08-02 ENCOUNTER — Telehealth (HOSPITAL_COMMUNITY): Payer: Self-pay | Admitting: Pharmacist

## 2020-08-02 NOTE — Telephone Encounter (Signed)
Third and final attempt to reach patient. 

## 2020-08-03 ENCOUNTER — Other Ambulatory Visit: Payer: Self-pay | Admitting: Surgery

## 2020-08-03 DIAGNOSIS — Z952 Presence of prosthetic heart valve: Secondary | ICD-10-CM

## 2020-08-06 ENCOUNTER — Ambulatory Visit
Admission: RE | Admit: 2020-08-06 | Discharge: 2020-08-06 | Disposition: A | Discharge status: Home / Self Care | Payer: Medicare HMO | Source: Ambulatory Visit | Attending: Surgery | Admitting: Surgery

## 2020-08-06 ENCOUNTER — Other Ambulatory Visit: Payer: Self-pay

## 2020-08-06 ENCOUNTER — Ambulatory Visit (INDEPENDENT_AMBULATORY_CARE_PROVIDER_SITE_OTHER): Payer: Self-pay | Admitting: Surgery

## 2020-08-06 ENCOUNTER — Encounter: Payer: Self-pay | Admitting: Surgery

## 2020-08-06 VITALS — BP 103/64 | HR 88 | Resp 20 | Ht 72.0 in | Wt 200.0 lb

## 2020-08-06 DIAGNOSIS — Z952 Presence of prosthetic heart valve: Secondary | ICD-10-CM

## 2020-08-06 MED ORDER — OXYCODONE HCL 5 MG PO CAPS: 5.0000 mg | ORAL_CAPSULE | Freq: Four times a day (QID) | ORAL | 0 refills | Status: AC | PRN

## 2020-08-06 NOTE — Progress Notes (Signed)
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  He had mild to moderate residual mitral regurgitation at the conclusion of the procedure due to an elongated anterior leaflet and systolic anterior motion.  His mitral regurgitation had decreased after aortic valve replacement and septal myectomy and I did not think it warranted mitral valve replacement. The patient's early postoperative recovery while in the hospital was notable for an uncomplicated postoperative course. Since hospital discharge the patient reports that he has been feeling well.  He is walking daily without shortness of breath.  He has mild incisional discomfort.  He still reports some discomfort along the left lateral chest wall.  He has been taking occasional oxycodone for this pain and would like a refill.  He said that he has returned to see his cardiologist in [place] and had some medication adjustments made.   Current Outpatient Medications  Medication Sig Dispense Refill   acetaminophen (TYLENOL) 325 MG tablet Take 2 tablets (650 mg total) by mouth every 6 (six) hours as needed for mild pain.     apixaban (ELIQUIS) 5 MG TABS tablet Take 1 tablet (5 mg total) by mouth 2 (two) times daily. 60 tablet 3   aspirin 81 MG EC tablet Take 81 mg by mouth daily.     atorvastatin (LIPITOR) 40 MG tablet Take 40 mg by mouth daily.     DULoxetine (CYMBALTA) 60 MG capsule Take 120 mg by mouth daily.     lamoTRIgine (LAMICTAL) 25 MG tablet Take 75 mg by mouth daily.     metoprolol tartrate (LOPRESSOR) 25 MG tablet Take 1 tablet (25 mg total) by mouth 2 (two) times daily. (Patient taking differently: Take 75 mg by mouth 2 (two) times daily. 25 mg (3 tablets BID) total of 75 mg BID) 60 tablet 2   Multiple Vitamins-Minerals (MULTI COMPLETE) CAPS Take 1 capsule by mouth daily.     nitroGLYCERIN (NITROSTAT) 0.4  MG SL tablet Place 0.4 mg under the tongue every 5 (five) minutes as needed for chest pain (max 3 doses).     omeprazole (PRILOSEC) 20 MG capsule Take 20 mg by mouth at bedtime.     oxycodone (OXY-IR) 5 MG capsule Take 1 capsule (5 mg total) by mouth every 6 (six) hours as needed. 30 capsule 0   saccharomyces boulardii (FLORASTOR) 250 MG capsule Take 250 mg by mouth 2 (two) times daily.     traZODone (DESYREL) 50 MG tablet Take 150 mg by mouth at bedtime.     amiodarone (PACERONE) 200 MG tablet Take 2 tablets (400 mg ) by mouth twice daily for 4 days then decrease the dose to 1 tablet (200 mg) by mouth twice daily thereafter. (Patient not taking: Reported on 08/06/2020) 60 tablet 1   furosemide (LASIX) 40 MG tablet Take 1 tablet (40 mg total) by mouth daily for 7 days. 7 tablet 0   potassium chloride SA (KLOR-CON) 20 MEQ tablet Take 1 tablet (20 mEq total) by mouth daily for 7 days. 7 tablet 0   No current facility-administered medications for this visit.    Physical Exam: BP 103/64 (BP Location: Right Arm, Patient Position: Sitting)   Pulse 88   Resp 20   Ht 6' (1.829 m)   Wt 200 lb (90.7 kg)   SpO2 96% Comment: RA  BMI 27.12 kg/m  He looks well. Cardiac exam shows a regular rate and  rhythm with normal heart sounds.  There is a 1/6 systolic murmur at the apex. Lungs are clear. The chest incision is healing well and the sternum is stable. There is no peripheral edema.  Diagnostic Tests:  Narrative & Impression  CLINICAL DATA:  Status post aortic valve replacement 07/09/2020. Chest pain and shortness of breath.   EXAM: CHEST - 2 VIEW   COMPARISON:  Radiograph 07/13/2020   FINDINGS: Post median sternotomy and aortic valve replacement. 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 Decreased cardiomegaly from prior exam. Unchanged mediastinal contours. Chronic eventration of right hemidiaphragm. Unchanged blunting of the left costophrenic angle which may be due to scarring or small  pleural effusions. No pulmonary edema. No consolidation or focal airspace disease. No pneumothorax. No acute osseous abnormalities are seen. Cervical spine are partially included. Thoracic spondylosis with endplate spurring.   IMPRESSION: 1. Post median sternotomy and aortic valve replacement. Decreased cardiomegaly from prior exam. 2. Blunting of the left costophrenic angle may be due to scarring or small pleural effusion.     Electronically Signed   By: Narda Rutherford M.D.   On: 08/06/2020 14:47      Impression:  Overall he is doing well almost 1 month following his surgery.  I encouraged him to continue walking as much as possible.  He is planning on participating in cardiac rehab and is trying to arrange that closer to home.  I told him that he can return to driving a car but should refrain from lifting anything heavier than 10 pounds for 3 months after surgery.  I would expect his chest wall discomfort to gradually improve with time.  Plan:  He will continue to follow-up with his cardiologist in [place] and his PCP.  He will return to see me if he has any problems with his incisions.  I renewed his oxycodone 5 mg every 6 hours as needed for pain #20.  I advised him to use Tylenol whenever possible.   [name], MD [facility] [phone]

## 2020-08-08 ENCOUNTER — Ambulatory Visit: Payer: Medicare HMO | Admitting: Surgery

## 2021-05-20 DEATH — deceased

## 2021-11-18 IMAGING — DX DG CHEST 2V
2 series · 2 of 2 positions shown · non-contrast
Comparison: 03/28/2020

CLINICAL DATA: Chest pain

EXAM:
CHEST - 2 VIEW

[chest pa]
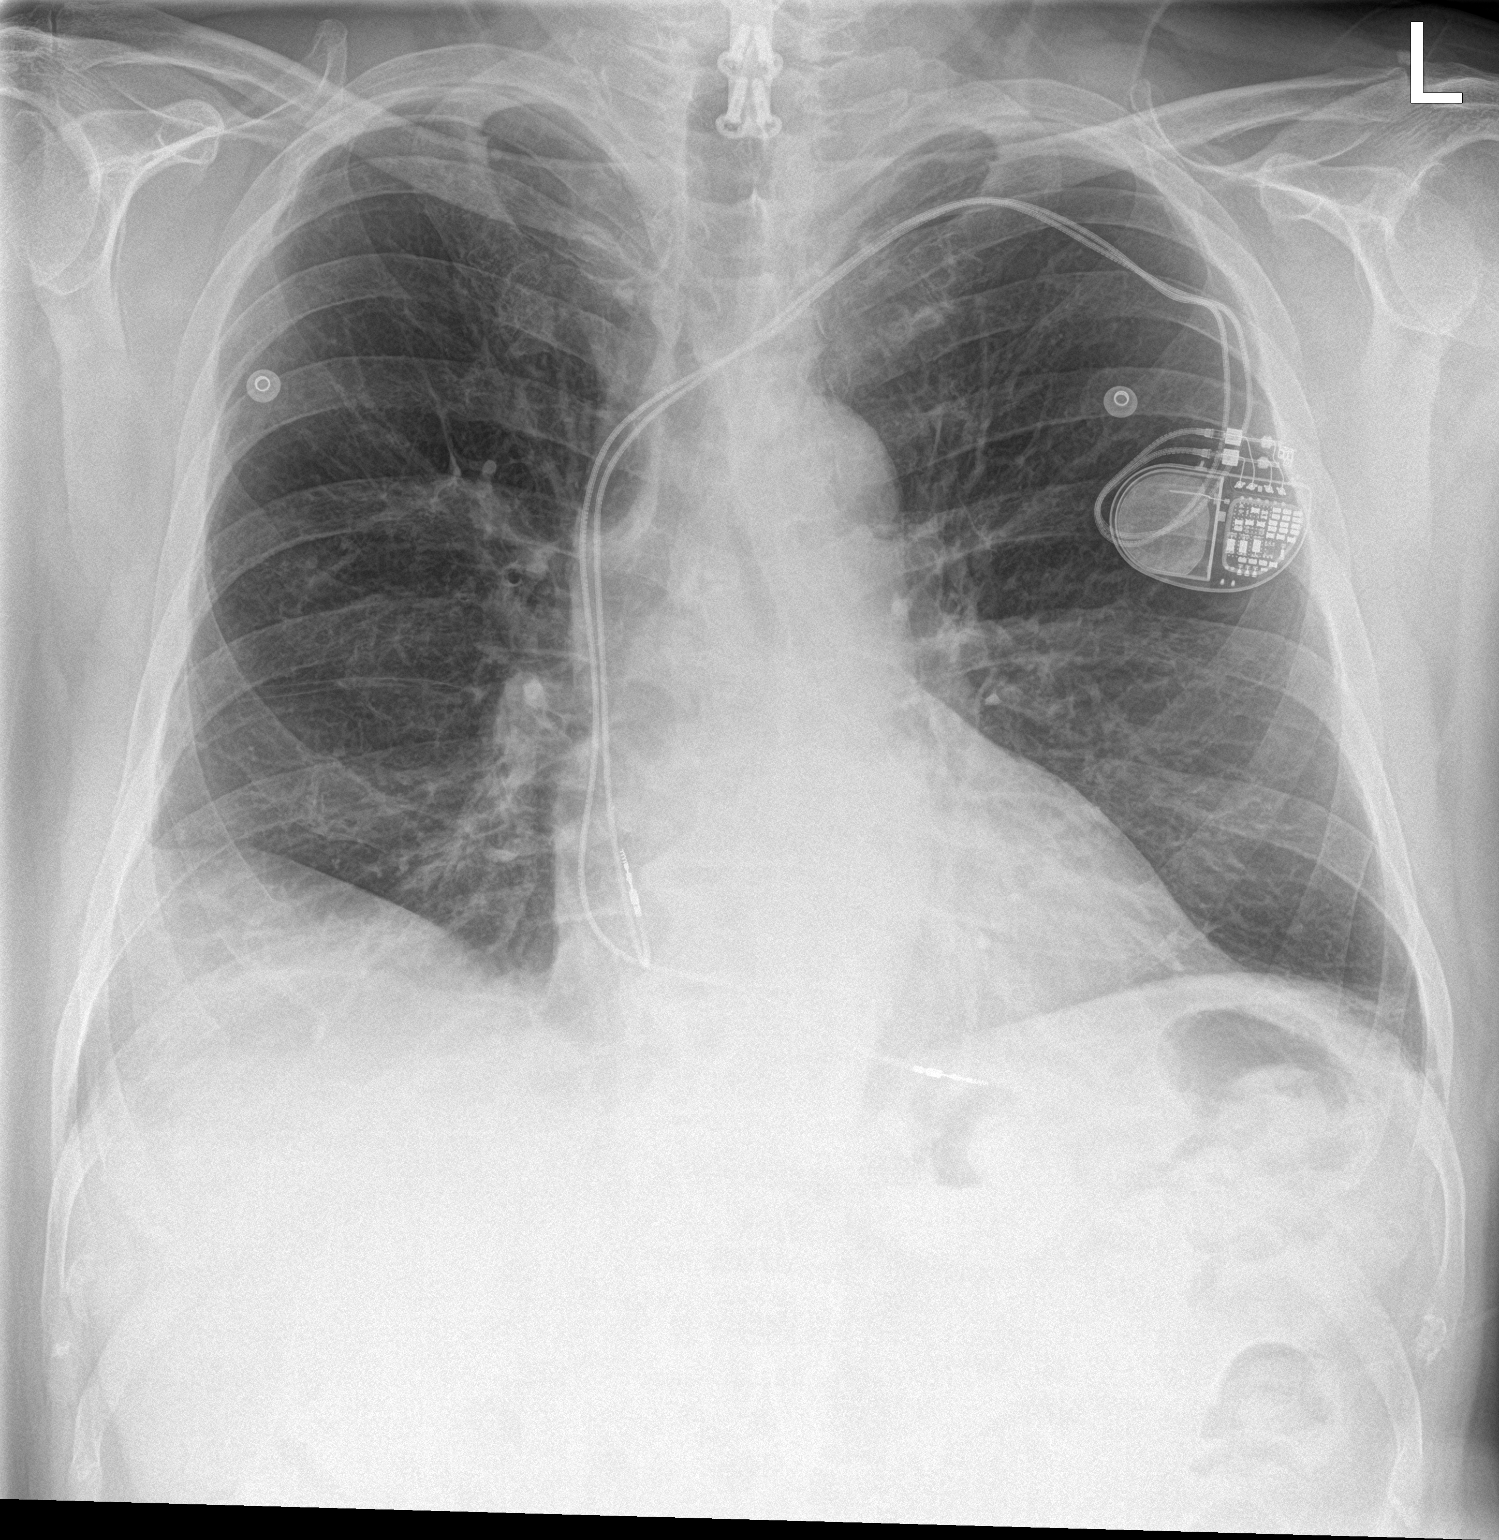

[chest lat]
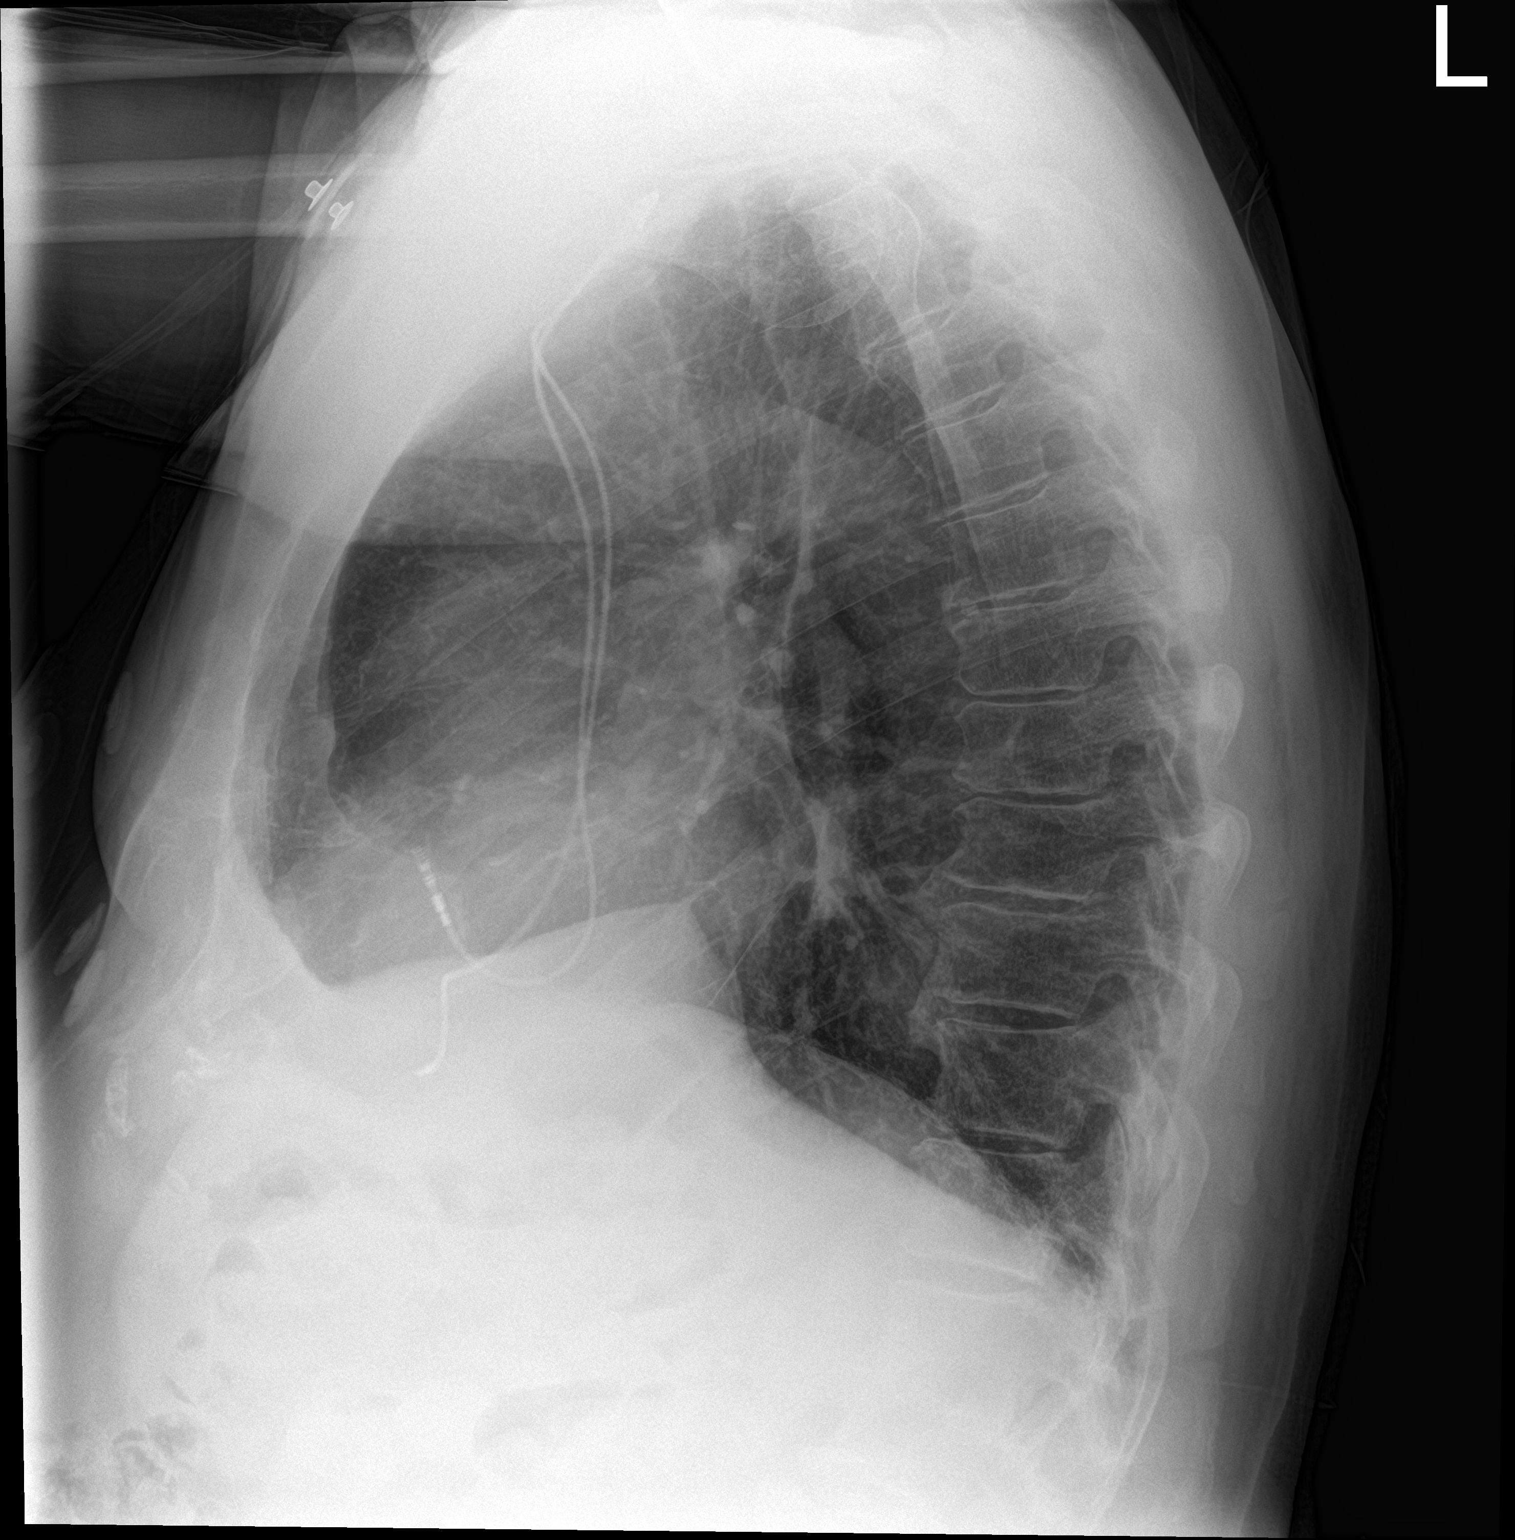

[2 of 2 positions shown; findings below may reference images not displayed]

FINDINGS: Surgical hardware in the cervical spine. Left-sided pacing device
with leads over the right atrium and right ventricle. Atelectasis or
scarring at the left base. Stable mild elevation right diaphragm. No
consolidation or effusion. Normal heart size. No pneumothorax
IMPRESSION: No active cardiopulmonary disease. Scarring or atelectasis at the
left base.

## 2022-02-07 IMAGING — DX DG CHEST 1V PORT
1 series · 1 of 1 positions shown · non-contrast
Comparison: Earlier film, same date.

CLINICAL DATA: Postop aortic valve replacement surgery.

EXAM:
PORTABLE CHEST 1 VIEW

[chest]
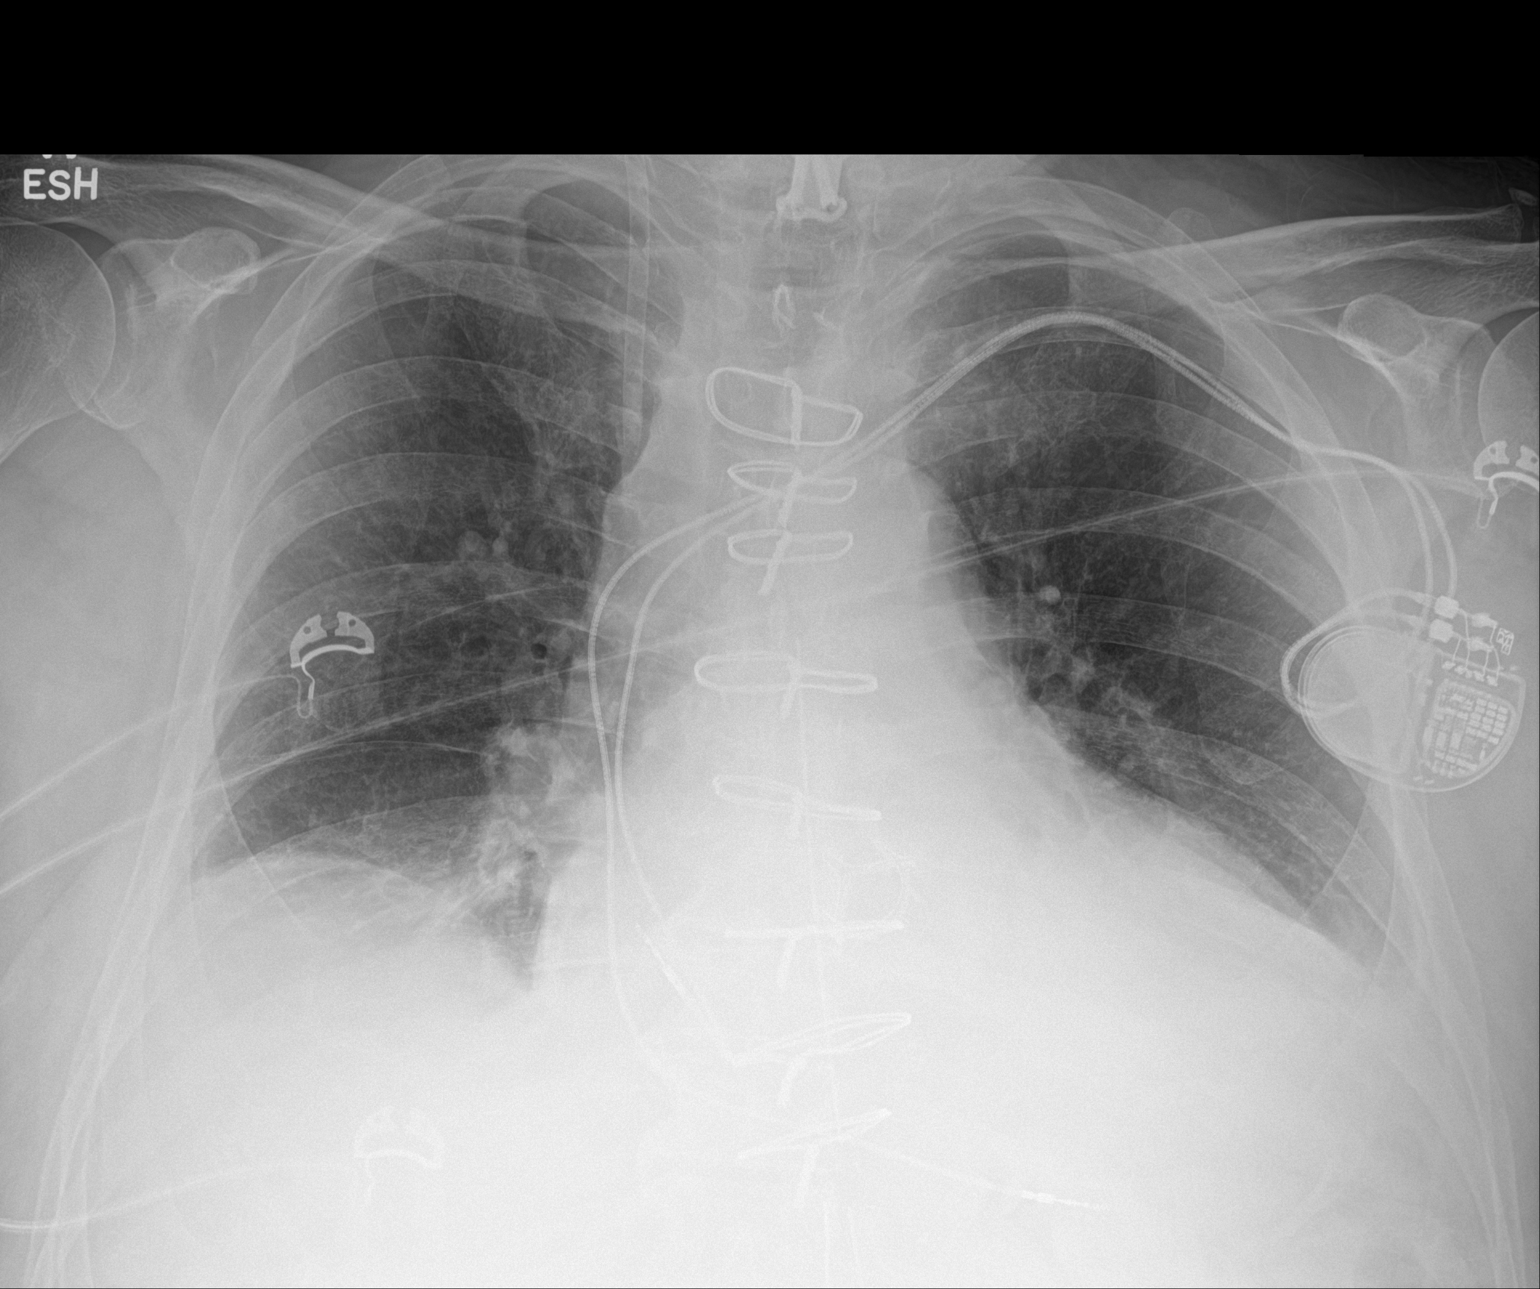

[1 of 1 positions shown; findings below may reference images not displayed]

FINDINGS: The Swan-Ganz catheter has been removed. The right IJ Cordis is
still in place.

The pacer wires are stable.

Persistent but improving bibasilar atelectasis and small left
effusion. No pulmonary edema or pneumothorax.
IMPRESSION: Persistent but improving bibasilar atelectasis and small left
effusion.

No pulmonary edema.

## 2022-03-06 IMAGING — DX DG CHEST 2V
2 series · 2 of 2 positions shown · non-contrast
Comparison: Radiograph 07/13/2020

CLINICAL DATA: Status post aortic valve replacement 07/09/2020.
Chest pain and shortness of breath.

EXAM:
CHEST - 2 VIEW

[dg chest 2 view (1 of 2)]
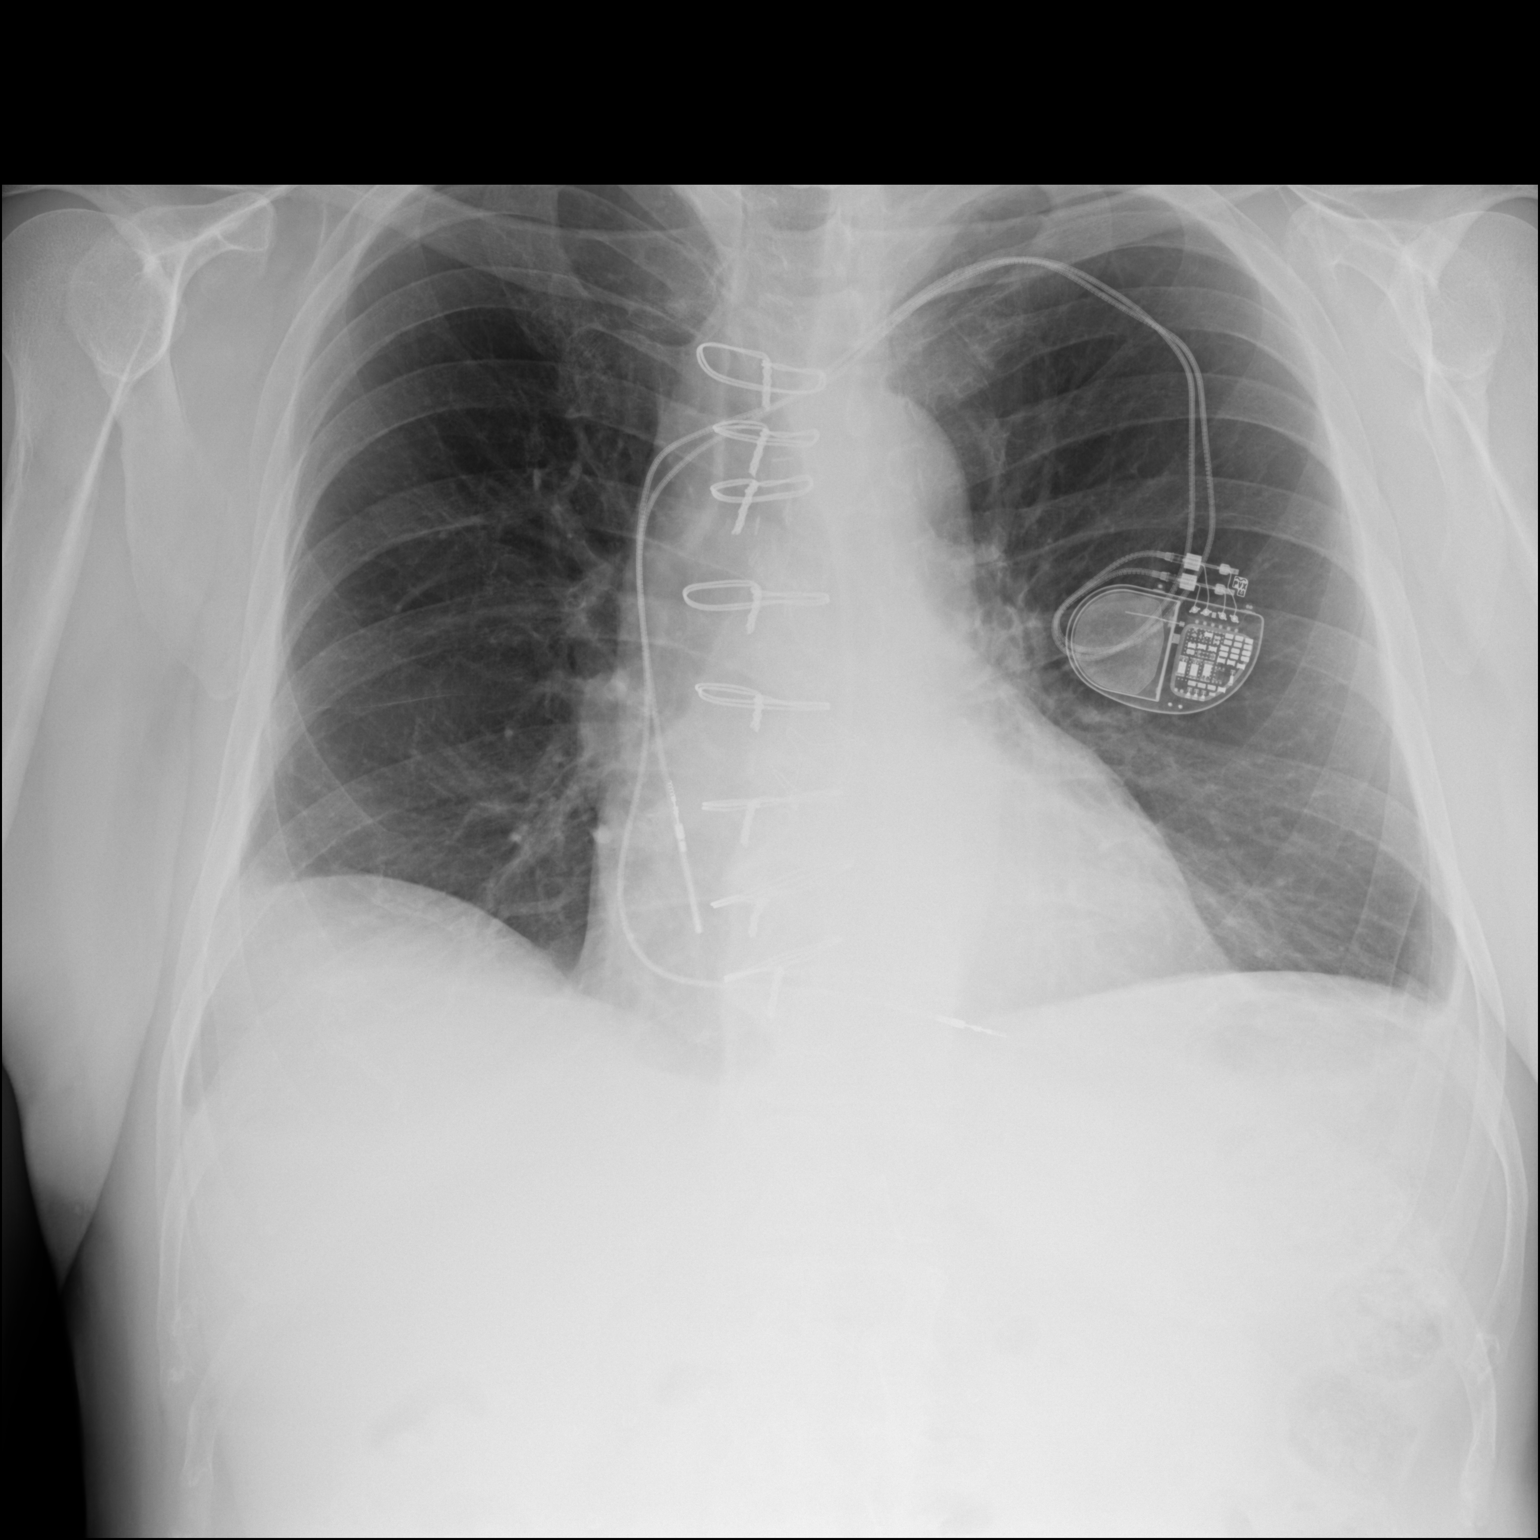

[dg chest 2 view (2 of 2)]
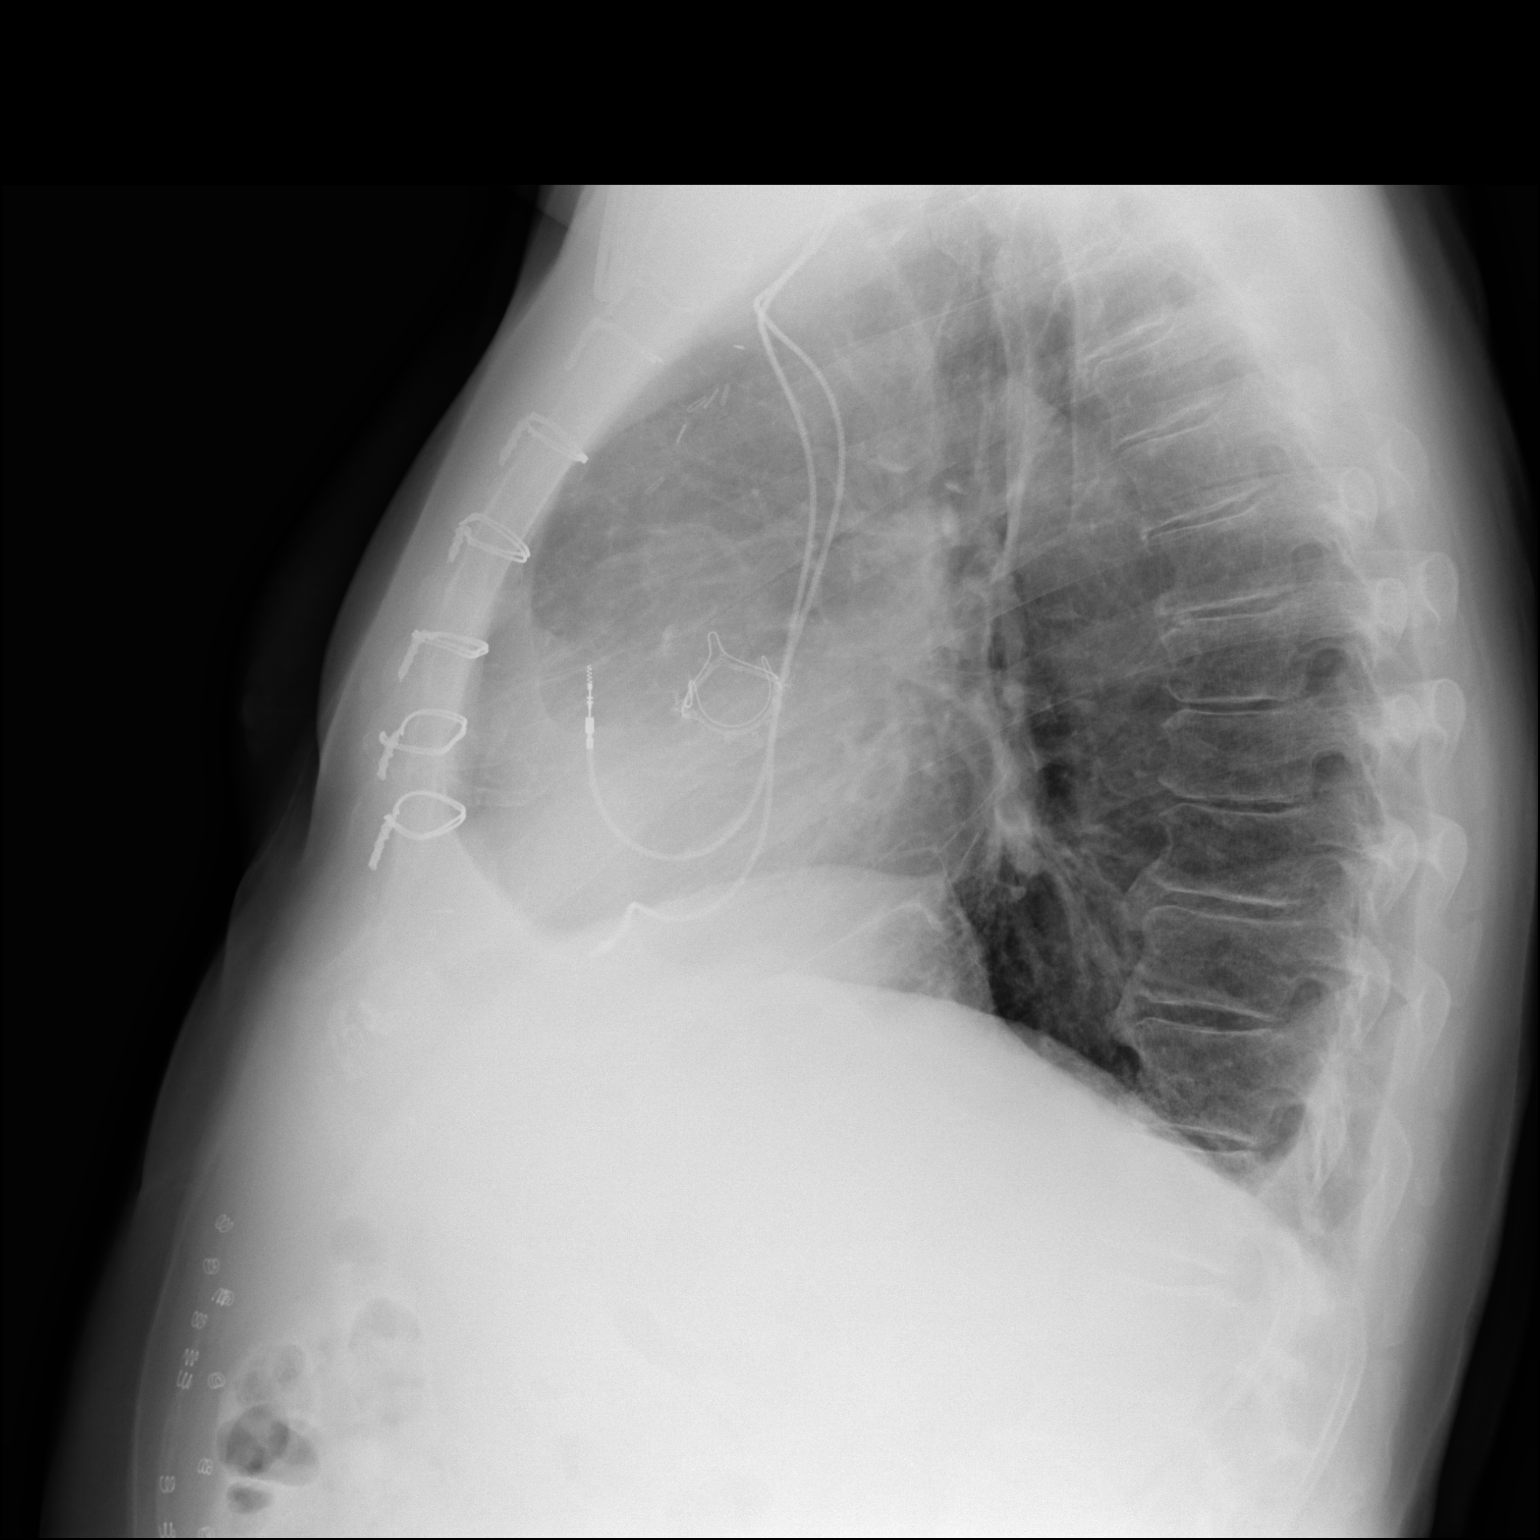

[2 of 2 positions shown; findings below may reference images not displayed]

FINDINGS: Post median sternotomy and aortic valve replacement. Left-sided
pacemaker remains in place. Decreased cardiomegaly from prior exam.
Unchanged mediastinal contours. Chronic eventration of right
hemidiaphragm. Unchanged blunting of the left costophrenic angle
which may be due to scarring or small pleural effusions. No
pulmonary edema. No consolidation or focal airspace disease. No
pneumothorax. No acute osseous abnormalities are seen. Cervical
spine are partially included. Thoracic spondylosis with endplate
spurring.
IMPRESSION: 1. Post median sternotomy and aortic valve replacement. Decreased
cardiomegaly from prior exam.
2. Blunting of the left costophrenic angle may be due to scarring or
small pleural effusion.
